# Patient Record
Sex: Female | Born: 1995 | Race: White | Hispanic: Yes | Marital: Single | Smoking: Never smoker
Health system: Southern US, Community
[De-identification: ages and names within clinical notes are randomized; demographics above are authoritative.]

## PROBLEM LIST (undated history)

## (undated) DIAGNOSIS — F32A Depression, unspecified: Secondary | ICD-10-CM

## (undated) DIAGNOSIS — F329 Major depressive disorder, single episode, unspecified: Secondary | ICD-10-CM

## (undated) DIAGNOSIS — F419 Anxiety disorder, unspecified: Secondary | ICD-10-CM

## (undated) HISTORY — PX: NO PAST SURGERIES: SHX2092

---

## 2016-03-25 ENCOUNTER — Encounter: Payer: Self-pay | Admitting: *Deleted

## 2016-03-25 ENCOUNTER — Encounter: Payer: Self-pay | Admitting: Certified Nurse Midwife

## 2016-03-25 ENCOUNTER — Ambulatory Visit (INDEPENDENT_AMBULATORY_CARE_PROVIDER_SITE_OTHER): Payer: Medicaid Other | Admitting: Certified Nurse Midwife

## 2016-03-25 VITALS — BP 109/73 | HR 70 | Ht 64.0 in

## 2016-03-25 DIAGNOSIS — Z30011 Encounter for initial prescription of contraceptive pills: Secondary | ICD-10-CM

## 2016-03-25 DIAGNOSIS — Z0000 Encounter for general adult medical examination without abnormal findings: Secondary | ICD-10-CM | POA: Diagnosis not present

## 2016-03-25 DIAGNOSIS — Z113 Encounter for screening for infections with a predominantly sexual mode of transmission: Secondary | ICD-10-CM

## 2016-03-25 DIAGNOSIS — Z8659 Personal history of other mental and behavioral disorders: Secondary | ICD-10-CM

## 2016-03-25 DIAGNOSIS — Z01419 Encounter for gynecological examination (general) (routine) without abnormal findings: Secondary | ICD-10-CM

## 2016-03-25 MED ORDER — NORETHIN-ETH ESTRAD-FE BIPHAS 1 MG-10 MCG / 10 MCG PO TABS: 1.0000 | ORAL_TABLET | Freq: Every day | ORAL | 4 refills | Status: DC

## 2016-03-25 NOTE — Patient Instructions (Addendum)
Contraception Choices  Contraception (birth control) is the use of any methods or devices to prevent pregnancy. Below are some methods to help avoid pregnancy.  HORMONAL METHODS   · Contraceptive implant. This is a thin, plastic tube containing progesterone hormone. It does not contain estrogen hormone. Your health care provider inserts the tube in the inner part of the upper arm. The tube can remain in place for up to 3 years. After 3 years, the implant must be removed. The implant prevents the ovaries from releasing an egg (ovulation), thickens the cervical mucus to prevent sperm from entering the uterus, and thins the lining of the inside of the uterus.  · Progesterone-only injections. These injections are given every 3 months by your health care provider to prevent pregnancy. This synthetic progesterone hormone stops the ovaries from releasing eggs. It also thickens cervical mucus and changes the uterine lining. This makes it harder for sperm to survive in the uterus.  · Birth control pills. These pills contain estrogen and progesterone hormone. They work by preventing the ovaries from releasing eggs (ovulation). They also cause the cervical mucus to thicken, preventing the sperm from entering the uterus. Birth control pills are prescribed by a health care provider. Birth control pills can also be used to treat heavy periods.  · Minipill. This type of birth control pill contains only the progesterone hormone. They are taken every day of each month and must be prescribed by your health care provider.  · Birth control patch. The patch contains hormones similar to those in birth control pills. It must be changed once a week and is prescribed by a health care provider.  · Vaginal ring. The ring contains hormones similar to those in birth control pills. It is left in the vagina for 3 weeks, removed for 1 week, and then a new one is put back in place. The patient must be comfortable inserting and removing the ring  from the vagina. A health care provider's prescription is necessary.  · Emergency contraception. Emergency contraceptives prevent pregnancy after unprotected sexual intercourse. This pill can be taken right after sex or up to 5 days after unprotected sex. It is most effective the sooner you take the pills after having sexual intercourse. Most emergency contraceptive pills are available without a prescription. Check with your pharmacist. Do not use emergency contraception as your only form of birth control.  BARRIER METHODS   · Female condom. This is a thin sheath (latex or rubber) that is worn over the penis during sexual intercourse. It can be used with spermicide to increase effectiveness.  · Female condom. This is a soft, loose-fitting sheath that is put into the vagina before sexual intercourse.  · Diaphragm. This is a soft, latex, dome-shaped barrier that must be fitted by a health care provider. It is inserted into the vagina, along with a spermicidal jelly. It is inserted before intercourse. The diaphragm should be left in the vagina for 6 to 8 hours after intercourse.  · Cervical cap. This is a round, soft, latex or plastic cup that fits over the cervix and must be fitted by a health care provider. The cap can be left in place for up to 48 hours after intercourse.  · Sponge. This is a soft, circular piece of polyurethane foam. The sponge has spermicide in it. It is inserted into the vagina after wetting it and before sexual intercourse.  · Spermicides. These are chemicals that kill or block sperm from entering the cervix and uterus. They   come in the form of creams, jellies, suppositories, foam, or tablets. They do not require a prescription. They are inserted into the vagina with an applicator before having sexual intercourse. The process must be repeated every time you have sexual intercourse.  INTRAUTERINE CONTRACEPTION  · Intrauterine device (IUD). This is a T-shaped device that is put in a woman's uterus  during a menstrual period to prevent pregnancy. There are 2 types:    Copper IUD. This type of IUD is wrapped in copper wire and is placed inside the uterus. Copper makes the uterus and fallopian tubes produce a fluid that kills sperm. It can stay in place for 10 years.    Hormone IUD. This type of IUD contains the hormone progestin (synthetic progesterone). The hormone thickens the cervical mucus and prevents sperm from entering the uterus, and it also thins the uterine lining to prevent implantation of a fertilized egg. The hormone can weaken or kill the sperm that get into the uterus. It can stay in place for 3-5 years, depending on which type of IUD is used.  PERMANENT METHODS OF CONTRACEPTION  · Female tubal ligation. This is when the woman's fallopian tubes are surgically sealed, tied, or blocked to prevent the egg from traveling to the uterus.  · Hysteroscopic sterilization. This involves placing a small coil or insert into each fallopian tube. Your doctor uses a technique called hysteroscopy to do the procedure. The device causes scar tissue to form. This results in permanent blockage of the fallopian tubes, so the sperm cannot fertilize the egg. It takes about 3 months after the procedure for the tubes to become blocked. You must use another form of birth control for these 3 months.  · Female sterilization. This is when the female has the tubes that carry sperm tied off (vasectomy). This blocks sperm from entering the vagina during sexual intercourse. After the procedure, the man can still ejaculate fluid (semen).  NATURAL PLANNING METHODS  · Natural family planning. This is not having sexual intercourse or using a barrier method (condom, diaphragm, cervical cap) on days the woman could become pregnant.  · Calendar method. This is keeping track of the length of each menstrual cycle and identifying when you are fertile.  · Ovulation method. This is avoiding sexual intercourse during ovulation.  · Symptothermal  method. This is avoiding sexual intercourse during ovulation, using a thermometer and ovulation symptoms.  · Post-ovulation method. This is timing sexual intercourse after you have ovulated.  Regardless of which type or method of contraception you choose, it is important that you use condoms to protect against the transmission of sexually transmitted infections (STIs). Talk with your health care provider about which form of contraception is most appropriate for you.     This information is not intended to replace advice given to you by your health care provider. Make sure you discuss any questions you have with your health care provider.     Document Released: 07/13/2005 Document Revised: 07/18/2013 Document Reviewed: 01/05/2013  Elsevier Interactive Patient Education ©2016 [employer]  Oral Contraception Information  Oral contraceptive pills (OCPs) are medicines taken to prevent pregnancy. OCPs work by preventing the ovaries from releasing eggs. The hormones in OCPs also cause the cervical mucus to thicken, preventing the sperm from entering the uterus. The hormones also cause the uterine lining to become thin, not allowing a fertilized egg to attach to the inside of the uterus. OCPs are highly effective when taken exactly as prescribed. However, OCPs do not   prevent sexually transmitted diseases (STDs). Safe sex practices, such as using condoms along with the pill, can help prevent STDs.   Before taking the pill, you may have a physical exam and Pap test. Your health care provider may order blood tests. The health care provider will make sure you are a good candidate for oral contraception. Discuss with your health care provider the possible side effects of the OCP you may be prescribed. When starting an OCP, it can take 2 to 3 months for the body to adjust to the changes in hormone levels in your body.   TYPES OF ORAL CONTRACEPTION  · The combination pill--This pill contains estrogen and progestin (synthetic  progesterone) hormones. The combination pill comes in 21-day, 28-day, or 91-day packs. Some types of combination pills are meant to be taken continuously (365-day pills). With 21-day packs, you do not take pills for 7 days after the last pill. With 28-day packs, the pill is taken every day. The last 7 pills are without hormones. Certain types of pills have more than 21 hormone-containing pills. With 91-day packs, the first 84 pills contain both hormones, and the last 7 pills contain no hormones or contain estrogen only.  · The minipill--This pill contains the progesterone hormone only. The pill is taken every day continuously. It is very important to take the pill at the same time each day. The minipill comes in packs of 28 pills. All 28 pills contain the hormone.    ADVANTAGES OF ORAL CONTRACEPTIVE PILLS  · Decreases premenstrual symptoms.    · Treats menstrual period cramps.    · Regulates the menstrual cycle.    · Decreases a heavy menstrual flow.    · May treat acne, depending on the type of pill.    · Treats abnormal uterine bleeding.    · Treats polycystic ovarian syndrome.    · Treats endometriosis.    · Can be used as emergency contraception.    THINGS THAT CAN MAKE ORAL CONTRACEPTIVE PILLS LESS EFFECTIVE  OCPs can be less effective if:   · You forget to take the pill at the same time every day.    · You have a stomach or intestinal disease that lessens the absorption of the pill.    · You take OCPs with other medicines that make OCPs less effective, such as antibiotics, certain HIV medicines, and some seizure medicines.    · You take expired OCPs.    · You forget to restart the pill on day 7, when using the packs of 21 pills.    RISKS ASSOCIATED WITH ORAL CONTRACEPTIVE PILLS   Oral contraceptive pills can sometimes cause side effects, such as:  · Headache.  · Nausea.  · Breast tenderness.  · Irregular bleeding or spotting.  Combination pills are also associated with a small increased risk of:  · Blood  clots.  · Heart attack.  · Stroke.     This information is not intended to replace advice given to you by your health care provider. Make sure you discuss any questions you have with your health care provider.     Document Released: 10/03/2002 Document Revised: 05/03/2013 Document Reviewed: 01/01/2013  Elsevier Interactive Patient Education ©2016 Else[employer] Inc.

## 2016-03-25 NOTE — Progress Notes (Signed)
Subjective:        Erica Mccarthy is a 20 y.o. female here for a routine exam.  Current complaints: desires birth control.  Desires full STD screening exam.  Desires birth control.  Is currently sexually active.  Periods are regular, normal, lasting about a week 5-7 days, has heavy periods and dysmenorrhea since she started.  20 years of age when she started having periods.  Was on OCPs in the past.       Irregular use of condoms.  LMP was the 15th of August.  Denies any chronic medical conditions.  [facility] [employer].    Personal health questionnaire:  Is patient Erica Mccarthy, have a family history of breast and/or ovarian cancer: yes, Erica Mccarthy Is there a family history of uterine cancer diagnosed at age < 6050, gastrointestinal cancer, urinary tract cancer, family member who is a Erica officerLynch syndrome-associated carrier: no Is the patient overweight and hypertensive, family history of diabetes, personal history of gestational diabetes, preeclampsia or PCOS: no Is patient over 4755, have PCOS,  family history of premature CHD under age 22, diabetes, smoke, have hypertension or peripheral artery disease:  yes At any time, has a partner hit, kicked or otherwise hurt or frightened you?: no; parents separated.   Over the past 2 weeks, have you felt down, depressed or hopeless?: yes, denies any homicidal/suicidal thoughts/plans.   Over the past 2 weeks, have you felt little interest or pleasure in doing things?:no   Gynecologic History Patient's last menstrual period was 03/08/2016. Contraception: condoms Last Pap: N/A.  Last mammogram: N/A.   Obstetric History OB History  No data available    No past medical history on file.  No past surgical history on file.   Current Outpatient Prescriptions:  .  Norethindrone-Ethinyl Estradiol-Fe Biphas (LO LOESTRIN FE) 1 MG-10 MCG / 10 MCG tablet, Take 1 tablet by mouth daily. Take 1 tablet by mouth daily., Disp: 3 Package, Rfl: 4 Not on File  Social  History  Substance Use Topics  . Smoking status: Never Smoker  . Smokeless tobacco: Never Used  . Alcohol use Yes     Comment: occ    Family History  Problem Relation Age of Onset  . Diabetes Paternal Grandfather   . Diabetes Father       Review of Systems  Constitutional: negative for fatigue and weight loss Respiratory: negative for cough and wheezing Cardiovascular: negative for chest pain, fatigue and palpitations Gastrointestinal: negative for abdominal pain and change in bowel habits Musculoskeletal:negative for myalgias Neurological: negative for gait problems and tremors Behavioral/Psych: negative for abusive relationship, depression Endocrine: negative for temperature intolerance   Genitourinary:negative for abnormal menstrual periods, genital lesions, hot flashes, sexual problems and vaginal discharge Integument/breast: negative for breast lump, breast tenderness, nipple discharge and skin lesion(s)    Objective:       BP 109/73   Pulse 70   Ht 5\' 4"  (1.626 m)   LMP 03/08/2016  General:   alert  Skin:   no rash or abnormalities  Lungs:   clear to auscultation bilaterally  Heart:   regular rate and rhythm, S1, S2 normal, no murmur, click, rub or gallop  Breasts:   normal without suspicious masses, skin or nipple changes or axillary nodes  Abdomen:  normal findings: no organomegaly, soft, non-tender and no hernia  Pelvis:  External genitalia: normal general appearance Urinary system: urethral meatus normal and bladder without fullness, nontender Vaginal: normal without tenderness, induration or masses Cervix: normal  appearance Adnexa: normal bimanual exam Uterus: anteverted and non-tender, normal size   Lab Review Urine pregnancy test Labs reviewed no Radiologic studies reviewed no  50% of 30 min visit spent on counseling and coordination of care.   Assessment:    Healthy female exam.   STD screening exam  Contraception management  H/O Depression   H/O AUB  Plan:    Education reviewed: calcium supplements, depression evaluation, low fat, low cholesterol diet, safe sex/STD prevention, self breast exams, skin cancer screening and weight bearing exercise. Contraception: OCP (estrogen/progesterone). Follow up in: 1 year.   Meds ordered this encounter  Medications  . Norethindrone-Ethinyl Estradiol-Fe Biphas (LO LOESTRIN FE) 1 MG-10 MCG / 10 MCG tablet    Sig: Take 1 tablet by mouth daily. Take 1 tablet by mouth daily.    Dispense:  3 Package    Refill:  4    BIN # [id number], PCN# [id number], ID#: [id number]   Orders Placed This Encounter  Procedures  . Hepatitis B surface antigen  . RPR  . Hepatitis C antibody  . HIV antibody  . CBC with Differential/Platelet  . Comprehensive metabolic panel  . TSH   Need to obtain previous records Possible management options include:LARK, nuva ring, patch Follow up as needed.

## 2016-03-26 LAB — COMPREHENSIVE METABOLIC PANEL
A/G RATIO: 1.4 (ref 1.2–2.2)
ALBUMIN: 4.4 g/dL (ref 3.5–5.5)
ALK PHOS: 49 IU/L (ref 39–117)
ALT: 9 IU/L (ref 0–32)
AST: 21 IU/L (ref 0–40)
BILIRUBIN TOTAL: 0.2 mg/dL (ref 0.0–1.2)
BUN / CREAT RATIO: 14 (ref 9–23)
BUN: 10 mg/dL (ref 6–20)
CHLORIDE: 99 mmol/L (ref 96–106)
CO2: 26 mmol/L (ref 18–29)
Calcium: 9.3 mg/dL (ref 8.7–10.2)
Creatinine, Ser: 0.72 mg/dL (ref 0.57–1.00)
GFR calc non Af Amer: 120 mL/min/{1.73_m2} (ref >59)
GFR, EST AFRICAN AMERICAN: 138 mL/min/{1.73_m2} (ref >59)
GLUCOSE: 57 mg/dL — AB (ref 65–99)
Globulin, Total: 3.2 g/dL (ref 1.5–4.5)
POTASSIUM: 3.9 mmol/L (ref 3.5–5.2)
Sodium: 140 mmol/L (ref 134–144)
TOTAL PROTEIN: 7.6 g/dL (ref 6.0–8.5)

## 2016-03-26 LAB — HIV ANTIBODY (ROUTINE TESTING W REFLEX): HIV SCREEN 4TH GENERATION: NONREACTIVE

## 2016-03-26 LAB — CBC WITH DIFFERENTIAL/PLATELET
BASOS ABS: 0 10*3/uL (ref 0.0–0.2)
BASOS: 1 %
EOS (ABSOLUTE): 0.1 10*3/uL (ref 0.0–0.4)
Eos: 2 %
HEMOGLOBIN: 11.7 g/dL (ref 11.1–15.9)
Hematocrit: 36 % (ref 34.0–46.6)
Immature Grans (Abs): 0 10*3/uL (ref 0.0–0.1)
Immature Granulocytes: 0 %
LYMPHS ABS: 1.4 10*3/uL (ref 0.7–3.1)
Lymphs: 39 %
MCH: 28.1 pg (ref 26.6–33.0)
MCHC: 32.5 g/dL (ref 31.5–35.7)
MCV: 87 fL (ref 79–97)
Monocytes Absolute: 0.4 10*3/uL (ref 0.1–0.9)
Monocytes: 12 %
NEUTROS ABS: 1.6 10*3/uL (ref 1.4–7.0)
Neutrophils: 46 %
PLATELETS: 247 10*3/uL (ref 150–379)
RBC: 4.16 x10E6/uL (ref 3.77–5.28)
RDW: 14.2 % (ref 12.3–15.4)
WBC: 3.5 10*3/uL (ref 3.4–10.8)

## 2016-03-26 LAB — RPR: RPR Ser Ql: NONREACTIVE

## 2016-03-26 LAB — HEPATITIS B SURFACE ANTIGEN: HEP B S AG: NEGATIVE

## 2016-03-26 LAB — HEPATITIS C ANTIBODY

## 2016-03-26 LAB — TSH: TSH: 2.19 u[IU]/mL (ref 0.450–4.500)

## 2016-03-28 LAB — PAP IG W/ RFLX HPV ASCU: PAP Smear Comment: 0

## 2016-03-28 LAB — NUSWAB VG+, CANDIDA 6SP
ATOPOBIUM VAGINAE: HIGH {score} — AB
BVAB 2: HIGH {score} — AB
CANDIDA KRUSEI, NAA: NEGATIVE
CANDIDA LUSITANIAE, NAA: NEGATIVE
CHLAMYDIA TRACHOMATIS, NAA: NEGATIVE
Candida albicans, NAA: NEGATIVE
Candida glabrata, NAA: NEGATIVE
Candida parapsilosis, NAA: NEGATIVE
Candida tropicalis, NAA: NEGATIVE
Megasphaera 1: HIGH Score — AB
NEISSERIA GONORRHOEAE, NAA: NEGATIVE
TRICH VAG BY NAA: NEGATIVE

## 2016-04-01 ENCOUNTER — Other Ambulatory Visit: Payer: Self-pay | Admitting: Certified Nurse Midwife

## 2016-04-01 DIAGNOSIS — N760 Acute vaginitis: Principal | ICD-10-CM

## 2016-04-01 DIAGNOSIS — B9689 Other specified bacterial agents as the cause of diseases classified elsewhere: Secondary | ICD-10-CM

## 2016-04-01 MED ORDER — METRONIDAZOLE 500 MG PO TABS: 500.0000 mg | ORAL_TABLET | Freq: Two times a day (BID) | ORAL | 0 refills | Status: DC

## 2016-04-02 ENCOUNTER — Telehealth: Payer: Self-pay | Admitting: *Deleted

## 2016-04-02 NOTE — Telephone Encounter (Signed)
Unable to leave voicemail mail box not set up

## 2016-04-02 NOTE — Telephone Encounter (Signed)
Patient returned call and made aware of lab results and medication called to pharmacy.Aware not to drink alcohol while taking this medication.Call for yeast like symptoms.

## 2016-04-12 ENCOUNTER — Emergency Department (HOSPITAL_COMMUNITY)
Admission: EM | Admit: 2016-04-12 | Discharge: 2016-04-12 | Disposition: A | Discharge status: Home / Self Care | Payer: Medicaid Other | Attending: Emergency Medicine | Admitting: Emergency Medicine

## 2016-04-12 ENCOUNTER — Encounter (HOSPITAL_COMMUNITY): Payer: Self-pay

## 2016-04-12 DIAGNOSIS — Z79899 Other long term (current) drug therapy: Secondary | ICD-10-CM | POA: Insufficient documentation

## 2016-04-12 DIAGNOSIS — R531 Weakness: Secondary | ICD-10-CM | POA: Insufficient documentation

## 2016-04-12 DIAGNOSIS — R200 Anesthesia of skin: Secondary | ICD-10-CM | POA: Diagnosis present

## 2016-04-12 DIAGNOSIS — Z91040 Latex allergy status: Secondary | ICD-10-CM | POA: Insufficient documentation

## 2016-04-12 NOTE — Discharge Instructions (Addendum)
Please read and follow all provided instructions.  Your diagnoses today include:  1. Weakness    Tests performed today include: Vital signs. See below for your results today.   Medications prescribed:  Take as prescribed   Home care instructions:  Follow any educational materials contained in this packet.  Follow-up instructions: Please follow-up with your primary care provider for further evaluation of symptoms and treatment   Return instructions:  Please return to the Emergency Department if you do not get better, if you get worse, or new symptoms OR  - Fever (temperature greater than 101.46F)  - Bleeding that does not stop with holding pressure to the area    -Severe pain (please note that you may be more sore the day after your accident)  - Chest Pain  - Difficulty breathing  - Severe nausea or vomiting  - Inability to tolerate food and liquids  - Passing out  - Skin becoming red around your wounds  - Change in mental status (confusion or lethargy)  - New numbness or weakness    Please return if you have any other emergent concerns.  Additional Information:  Your vital signs today were: BP 115/84 (BP Location: Left Arm)    Pulse 91    Temp 97.7 F (36.5 C) (Oral)    Resp 17    Ht 5\' 4"  (1.626 m)    Wt 63.5 kg    LMP 04/08/2016 (Exact Date)    SpO2 95%    BMI 24.03 kg/m  If your blood pressure (BP) was elevated above 135/85 this visit, please have this repeated by your doctor within one month. ---------------

## 2016-04-12 NOTE — ED Notes (Signed)
Pt was given ice water for po challenge. 

## 2016-04-12 NOTE — ED Provider Notes (Signed)
[facility]-EMERGENCY DEPT Provider Note   CSN: [id number] Arrival date & time: 04/12/16  0002  By signing my name below, I, Erica Mccarthy, attest that this documentation has been prepared under the direction and in the presence of non-physician practitioner, Audry Piliyler Damoni [name], PA-C. Electronically Signed: Modena JanskyAlbert Mccarthy, Scribe. 04/12/2016. 1:25 AM.  History   Chief Complaint Chief Complaint  Patient presents with  . Fatigue  . Numbness   The history is provided by the patient. No language interpreter was used.   HPI Comments: Erica Mccarthy is a 20 y.o. female who presents to the Emergency Department complaining of intermittent moderate left-sided numbness that started today. Started taking norethindrone BCP today and started having multiple symptoms, including numbness, confusion, headache, and fatigue. During the episode she ate a honeybun snack thinking her blood sugar was low without any relief. Her symptoms have gradually improved since initial onset. No symptoms currently. She started her menstrual period today. Denies any pain or other complaints.   History reviewed. No pertinent past medical history.  Patient Active Problem List   Diagnosis Date Noted  . History of depression 03/25/2016    History reviewed. No pertinent surgical history.  OB History    No data available       Home Medications    Prior to Admission medications   Medication Sig Start Date End Date Taking? Authorizing Provider  metroNIDAZOLE (FLAGYL) 500 MG tablet Take 1 tablet (500 mg total) by mouth 2 (two) times daily. 04/01/16   [name], CNM  Norethindrone-Ethinyl Estradiol-Fe Biphas (LO LOESTRIN FE) 1 MG-10 MCG / 10 MCG tablet Take 1 tablet by mouth daily. Take 1 tablet by mouth daily. 03/25/16   Roe Coombsachelle A Denney, CNM    Family History Family History  Problem Relation Age of Onset  . Diabetes Paternal Grandfather   . Diabetes Father     Social History Social History  Substance Use Topics  .  Smoking status: Never Smoker  . Smokeless tobacco: Never Used  . Alcohol use Yes     Comment: occasionally   Allergies   Latex  Review of Systems Review of Systems A complete 10 system review of systems was obtained and all systems are negative except as noted in the HPI and PMH.   Physical Exam Updated Vital Signs BP 115/84 (BP Location: Left Arm)   Pulse 91   Temp 97.7 F (36.5 C) (Oral)   Resp 17   Ht 5\' 4"  (1.626 m)   Wt 140 lb (63.5 kg)   LMP 04/08/2016 (Exact Date)   SpO2 95%   BMI 24.03 kg/m   Physical Exam  Constitutional: She is oriented to person, place, and time. She appears well-developed and well-nourished. No distress.  HENT:  Head: Normocephalic.  Eyes: Conjunctivae and EOM are normal. Pupils are equal, round, and reactive to light.  Neck: Normal range of motion. Neck supple.  Cardiovascular: Normal rate, regular rhythm, normal heart sounds and intact distal pulses.  Exam reveals no gallop and no friction rub.   No murmur heard. Pulmonary/Chest: Effort normal and breath sounds normal. No respiratory distress. She has no wheezes. She has no rales. She exhibits no tenderness.  Abdominal: Soft.  Musculoskeletal: Normal range of motion.  Pt able to ambulate with steady gait  Neurological: She is alert and oriented to person, place, and time. She has normal strength and normal reflexes. No cranial nerve deficit or sensory deficit. She displays a negative Romberg sign.  Cranial Nerves:  II: Pupils equal, round,  reactive to light III,IV, VI: ptosis not present, extra-ocular motions intact bilaterally  V,VII: smile symmetric, facial light touch sensation equal VIII: hearing grossly normal bilaterally  IX,X: midline uvula rise  XI: bilateral shoulder shrug equal and strong XII: midline tongue extension  Skin: Skin is warm and dry.  Psychiatric: She has a normal mood and affect.  Nursing note and vitals reviewed.  ED Treatments / Results  DIAGNOSTIC  STUDIES: Oxygen Saturation is 95% on RA, normal by my interpretation.    COORDINATION OF CARE: 1:29 AM- Pt advised of plan for treatment and pt agrees.  Labs (all labs ordered are listed, but only abnormal results are displayed) Labs Reviewed - No data to display  EKG  EKG Interpretation None      Radiology No results found.  Procedures Procedures (including critical care time)  Medications Ordered in ED Medications - No data to display   Initial Impression / Assessment and Plan / ED Course  I have reviewed the triage vital signs and the nursing notes.  Pertinent labs & imaging results that were available during my care of the patient were reviewed by me and considered in my medical decision making (see chart for details).  Clinical Course   Final Clinical Impressions(s) / ED Diagnoses  I have reviewed the relevant previous healthcare records. I obtained HPI from historian.  ED Course:  Assessment: Pt is a 19yF who presents with weakness and left sided numbness s/p PO intake of new OCP. Notes resolution of symptoms in ED. Was previously on OCP 4 years ago and just restarted today. No N/V. No CP/SOB/ABD pain. On exam, pt in NAD. Nontoxic/nonseptic appearing. VSS. Afebrile. Lungs CTA. Heart RRR. CN evaluated and unremarkable. Extremity x4 DTRs unremarkable. No obvious neuro deficits identified. Possible OCP side effect due to hormone imbalance vs. Onset of menses today. Pt without symptoms currently. Plan is to DC home with follow up to PCP. At time of discharge, Patient is in no acute distress. Vital Signs are stable. Patient is able to ambulate. Patient able to tolerate PO.    Disposition/Plan:  DC Home Additional Verbal discharge instructions given and discussed with patient.  Pt Instructed to f/u with PCP in the next week for evaluation and treatment of symptoms. Return precautions given Pt acknowledges and agrees with plan  Supervising Physician April Palumbo,  MD   Final diagnoses:  Weakness    New Prescriptions New Prescriptions   No medications on file   I personally performed the services described in this documentation, which was scribed in my presence. The recorded information has been reviewed and is accurate.     Audry Pili, PA-C 04/12/16 1610    April Palumbo, MD 04/12/16 [phone]

## 2016-04-12 NOTE — ED Triage Notes (Signed)
Patient c/o fatigue and numbness on the left side of body.  Patient also states that her left hand is "colder" then her right hand.  Patient states that feels that it is a reaction to the new birth control that she just took the first dose of yesterday at 1500.  Patient denies chest pain, denies SOB.  Roommate states that patient has had no other symptoms except for being tired.  Patient denies pain.

## 2016-04-12 NOTE — ED Notes (Signed)
Pt tolerated fluid challenge--- pt denies nausea.

## 2016-04-22 ENCOUNTER — Encounter (HOSPITAL_COMMUNITY): Payer: Self-pay

## 2016-04-22 ENCOUNTER — Emergency Department (HOSPITAL_COMMUNITY)
Admission: EM | Admit: 2016-04-22 | Discharge: 2016-04-22 | Disposition: A | Discharge status: Home / Self Care | Payer: Medicaid Other | Attending: Emergency Medicine | Admitting: Emergency Medicine

## 2016-04-22 DIAGNOSIS — Z207 Contact with and (suspected) exposure to pediculosis, acariasis and other infestations: Secondary | ICD-10-CM

## 2016-04-22 DIAGNOSIS — Z2089 Contact with and (suspected) exposure to other communicable diseases: Secondary | ICD-10-CM

## 2016-04-22 DIAGNOSIS — R21 Rash and other nonspecific skin eruption: Secondary | ICD-10-CM | POA: Diagnosis present

## 2016-04-22 DIAGNOSIS — Z91040 Latex allergy status: Secondary | ICD-10-CM | POA: Insufficient documentation

## 2016-04-22 MED ORDER — PERMETHRIN 5 % EX CREA
TOPICAL_CREAM | CUTANEOUS | 0 refills | Status: DC
Start: 2016-04-22 — End: 2017-03-24

## 2016-04-22 NOTE — ED Provider Notes (Signed)
[facility]-EMERGENCY DEPT Provider Note   CSN: [id number] Arrival date & time: 04/22/16  1953     History   Chief Complaint Chief Complaint  Patient presents with  . Rash    HPI Erica Mccarthy is a 20 y.o. female presents with exposure to scabies. No significant PMH. She states her roommate who she lives in close quarters with (shares bathroom, common living spaces, etc) was diagnosed with scabies on Monday. She is unsure if she is treating herself or not. Denies rash on hands or feet but has several areas on thigh which is itchy and she is concerned about. Denies fever, chills, diffuse rash. She has been washing all of her clothes and linen and has a mattress protector. She would like a medication to treat herself in case she develops a worsening rash.  HPI  History reviewed. No pertinent past medical history.  Patient Active Problem List   Diagnosis Date Noted  . History of depression 03/25/2016    History reviewed. No pertinent surgical history.  OB History    No data available       Home Medications    Prior to Admission medications   Medication Sig Start Date End Date Taking? Authorizing Provider  metroNIDAZOLE (FLAGYL) 500 MG tablet Take 1 tablet (500 mg total) by mouth 2 (two) times daily. 04/01/16   [name], CNM  Norethindrone-Ethinyl Estradiol-Fe Biphas (LO LOESTRIN FE) 1 MG-10 MCG / 10 MCG tablet Take 1 tablet by mouth daily. Take 1 tablet by mouth daily. 03/25/16   [name], CNM  permethrin (ELIMITE) 5 % cream Apply from the neck down at bedtime. In the morning wash off. Repeat in 1 week if needed 04/22/16   Bethel BornKelly Marie Maycel Riffe, PA-C    Family History Family History  Problem Relation Age of Onset  . Diabetes Paternal Grandfather   . Diabetes Father     Social History Social History  Substance Use Topics  . Smoking status: Never Smoker  . Smokeless tobacco: Never Used  . Alcohol use Yes     Comment: occasionally     Allergies    Latex   Review of Systems Review of Systems  Constitutional: Negative for fever.  Skin:       Possible bites on right upper thigh     Physical Exam Updated Vital Signs BP 119/73 (BP Location: Left Arm)   Pulse 63   Temp 98.1 F (36.7 C) (Oral)   Resp 15   LMP 04/08/2016 (Exact Date)   SpO2 100%   Physical Exam  Constitutional: She is oriented to person, place, and time. She appears well-developed and well-nourished. No distress.  HENT:  Head: Normocephalic and atraumatic.  Eyes: Conjunctivae are normal. Pupils are equal, round, and reactive to light. Right eye exhibits no discharge. Left eye exhibits no discharge. No scleral icterus.  Neck: Normal range of motion. Neck supple.  Cardiovascular: Normal rate and regular rhythm.   No murmur heard. Pulmonary/Chest: Effort normal and breath sounds normal. No respiratory distress.  Abdominal: Soft. She exhibits no distension. There is no tenderness.  Musculoskeletal: She exhibits no edema.  Neurological: She is alert and oriented to person, place, and time.  Skin: Skin is warm and dry.  Small red bumps on right upper thigh. No rash on hands or feet.  Psychiatric: She has a normal mood and affect. Her behavior is normal.  Nursing note and vitals reviewed.    ED Treatments / Results  Labs (all labs ordered are  listed, but only abnormal results are displayed) Labs Reviewed - No data to display  EKG  EKG Interpretation None       Radiology No results found.  Procedures Procedures (including critical care time)  Medications Ordered in ED Medications - No data to display   Initial Impression / Assessment and Plan / ED Course  I have reviewed the triage vital signs and the nursing notes.  Pertinent labs & imaging results that were available during my care of the patient were reviewed by me and considered in my medical decision making (see chart for details).  Clinical Course   20 year old with scabies  exposure. Will rx prophylactic Permethrin. Patient is NAD, non-toxic, with stable VS. Patient is informed of clinical course, understands medical decision making process, and agrees with plan. Opportunity for questions provided and all questions answered. Return precautions given.   Final Clinical Impressions(s) / ED Diagnoses   Final diagnoses:  Exposure to scabies    New Prescriptions Discharge Medication List as of 04/22/2016  9:38 PM    START taking these medications   Details  permethrin (ELIMITE) 5 % cream Apply from the neck down at bedtime. In the morning wash off. Repeat in 1 week if needed, Print         Bethel Born, PA-C 04/22/16 2224    Pricilla Loveless, MD 04/27/16 [id number]

## 2016-04-22 NOTE — ED Notes (Signed)
Patient was alert, oriented and stable upon discharge. RN went over AVS and patient had no further questions.  

## 2016-04-22 NOTE — ED Triage Notes (Signed)
Pts roommate was dx with scabies on Monday and now she is itching all over and wants to be checked.

## 2017-03-16 ENCOUNTER — Encounter: Payer: Self-pay | Admitting: Family Medicine

## 2017-03-16 ENCOUNTER — Ambulatory Visit (INDEPENDENT_AMBULATORY_CARE_PROVIDER_SITE_OTHER): Payer: Medicaid Other

## 2017-03-16 DIAGNOSIS — Z3201 Encounter for pregnancy test, result positive: Secondary | ICD-10-CM | POA: Diagnosis present

## 2017-03-16 LAB — POCT PREGNANCY, URINE: Preg Test, Ur: POSITIVE — AB

## 2017-03-16 NOTE — Progress Notes (Signed)
Patient presented to the office for a UPT. UPT positive. Patient was unsure of LMP. Scheduled patient for an ultrasound.

## 2017-03-23 ENCOUNTER — Encounter (HOSPITAL_COMMUNITY): Payer: Self-pay | Admitting: *Deleted

## 2017-03-23 ENCOUNTER — Ambulatory Visit (HOSPITAL_COMMUNITY)
Admission: RE | Admit: 2017-03-23 | Discharge: 2017-03-23 | Disposition: A | Discharge status: Home / Self Care | Payer: Medicaid Other | Source: Ambulatory Visit | Attending: Family Medicine | Admitting: Family Medicine

## 2017-03-23 ENCOUNTER — Inpatient Hospital Stay (HOSPITAL_COMMUNITY): Payer: Medicaid Other | Admitting: Anesthesiology

## 2017-03-23 ENCOUNTER — Ambulatory Visit (HOSPITAL_COMMUNITY)
Admission: AD | Admit: 2017-03-23 | Discharge: 2017-03-24 | Disposition: A | Discharge status: Home / Self Care | Payer: Medicaid Other | Source: Ambulatory Visit | Attending: Family Medicine | Admitting: Family Medicine

## 2017-03-23 ENCOUNTER — Ambulatory Visit: Payer: Medicaid Other | Admitting: Certified Nurse Midwife

## 2017-03-23 ENCOUNTER — Encounter (HOSPITAL_COMMUNITY)
Admission: AD | Disposition: A | Discharge status: Home / Self Care | Payer: Self-pay | Source: Ambulatory Visit | Attending: Family Medicine

## 2017-03-23 DIAGNOSIS — Z3201 Encounter for pregnancy test, result positive: Secondary | ICD-10-CM

## 2017-03-23 DIAGNOSIS — O00101 Right tubal pregnancy without intrauterine pregnancy: Secondary | ICD-10-CM | POA: Diagnosis not present

## 2017-03-23 DIAGNOSIS — K661 Hemoperitoneum: Secondary | ICD-10-CM

## 2017-03-23 DIAGNOSIS — Z91040 Latex allergy status: Secondary | ICD-10-CM | POA: Insufficient documentation

## 2017-03-23 HISTORY — PX: DIAGNOSTIC LAPAROSCOPY WITH REMOVAL OF ECTOPIC PREGNANCY: SHX6449

## 2017-03-23 HISTORY — DX: Anxiety disorder, unspecified: F41.9

## 2017-03-23 HISTORY — DX: Depression, unspecified: F32.A

## 2017-03-23 HISTORY — DX: Major depressive disorder, single episode, unspecified: F32.9

## 2017-03-23 LAB — CBC
HCT: 28.9 % — ABNORMAL LOW (ref 36.0–46.0)
Hemoglobin: 10 g/dL — ABNORMAL LOW (ref 12.0–15.0)
MCH: 29.8 pg (ref 26.0–34.0)
MCHC: 34.6 g/dL (ref 30.0–36.0)
MCV: 86 fL (ref 78.0–100.0)
PLATELETS: 252 10*3/uL (ref 150–400)
RBC: 3.36 MIL/uL — AB (ref 3.87–5.11)
RDW: 13 % (ref 11.5–15.5)
WBC: 5.2 10*3/uL (ref 4.0–10.5)

## 2017-03-23 LAB — TYPE AND SCREEN
ABO/RH(D): O POS
ANTIBODY SCREEN: NEGATIVE

## 2017-03-23 LAB — HCG, QUANTITATIVE, PREGNANCY: HCG, BETA CHAIN, QUANT, S: 2819 m[IU]/mL — AB (ref <5)

## 2017-03-23 LAB — HEPATITIS B SURFACE ANTIGEN: Hepatitis B Surface Ag: NEGATIVE

## 2017-03-23 SURGERY — LAPAROSCOPY, WITH ECTOPIC PREGNANCY SURGICAL TREATMENT
Anesthesia: General | Site: Abdomen

## 2017-03-23 MED ORDER — SUCCINYLCHOLINE CHLORIDE 200 MG/10ML IV SOSY
PREFILLED_SYRINGE | INTRAVENOUS | Status: AC
  Filled 2017-03-23: qty 10

## 2017-03-23 MED ORDER — FAMOTIDINE IN NACL 20-0.9 MG/50ML-% IV SOLN
INTRAVENOUS | Status: AC
  Filled 2017-03-23: qty 50

## 2017-03-23 MED ORDER — SUGAMMADEX SODIUM 200 MG/2ML IV SOLN
INTRAVENOUS | Status: DC | PRN
  Administered 2017-03-23: 130 mg via INTRAVENOUS

## 2017-03-23 MED ORDER — MEPERIDINE HCL 25 MG/ML IJ SOLN: 6.2500 mg | INTRAMUSCULAR | Status: DC | PRN

## 2017-03-23 MED ORDER — FENTANYL CITRATE (PF) 250 MCG/5ML IJ SOLN
INTRAMUSCULAR | Status: AC
  Filled 2017-03-23: qty 5

## 2017-03-23 MED ORDER — MIDAZOLAM HCL 2 MG/2ML IJ SOLN
INTRAMUSCULAR | Status: AC
  Filled 2017-03-23: qty 2

## 2017-03-23 MED ORDER — SODIUM CHLORIDE 0.9 % IR SOLN
Status: DC | PRN
  Administered 2017-03-23: 3000 mL

## 2017-03-23 MED ORDER — ROCURONIUM BROMIDE 100 MG/10ML IV SOLN
INTRAVENOUS | Status: DC | PRN
  Administered 2017-03-23: 25 mg via INTRAVENOUS

## 2017-03-23 MED ORDER — HYDROMORPHONE HCL 1 MG/ML IJ SOLN
INTRAMUSCULAR | Status: AC
  Filled 2017-03-23: qty 1

## 2017-03-23 MED ORDER — SOD CITRATE-CITRIC ACID 500-334 MG/5ML PO SOLN
30.0000 mL | Freq: Once | ORAL | Status: AC
  Administered 2017-03-23: 30 mL via ORAL

## 2017-03-23 MED ORDER — PROPOFOL 10 MG/ML IV BOLUS
INTRAVENOUS | Status: DC | PRN
  Administered 2017-03-23: 190 mg via INTRAVENOUS

## 2017-03-23 MED ORDER — PROPOFOL 10 MG/ML IV BOLUS
INTRAVENOUS | Status: AC
  Filled 2017-03-23: qty 20

## 2017-03-23 MED ORDER — GLYCOPYRROLATE 0.2 MG/ML IJ SOLN
INTRAMUSCULAR | Status: AC
  Filled 2017-03-23: qty 1

## 2017-03-23 MED ORDER — METOCLOPRAMIDE HCL 5 MG/ML IJ SOLN: 10.0000 mg | Freq: Once | INTRAMUSCULAR | Status: DC | PRN

## 2017-03-23 MED ORDER — IBUPROFEN 600 MG PO TABS: 600.0000 mg | ORAL_TABLET | Freq: Four times a day (QID) | ORAL | 0 refills | Status: DC | PRN

## 2017-03-23 MED ORDER — LIDOCAINE HCL (CARDIAC) 20 MG/ML IV SOLN
INTRAVENOUS | Status: AC
  Filled 2017-03-23: qty 5

## 2017-03-23 MED ORDER — ONDANSETRON HCL 4 MG/2ML IJ SOLN
INTRAMUSCULAR | Status: DC | PRN
  Administered 2017-03-23: 4 mg via INTRAVENOUS

## 2017-03-23 MED ORDER — OXYCODONE-ACETAMINOPHEN 5-325 MG PO TABS: 1.0000 | ORAL_TABLET | Freq: Four times a day (QID) | ORAL | 0 refills | Status: DC | PRN

## 2017-03-23 MED ORDER — PHENYLEPHRINE HCL 10 MG/ML IJ SOLN
INTRAMUSCULAR | Status: DC | PRN
  Administered 2017-03-23 (×2): 80 ug via INTRAVENOUS

## 2017-03-23 MED ORDER — DEXAMETHASONE SODIUM PHOSPHATE 10 MG/ML IJ SOLN
INTRAMUSCULAR | Status: AC
  Filled 2017-03-23: qty 1

## 2017-03-23 MED ORDER — DEXAMETHASONE SODIUM PHOSPHATE 4 MG/ML IJ SOLN
INTRAMUSCULAR | Status: DC | PRN
  Administered 2017-03-23: 10 mg via INTRAVENOUS

## 2017-03-23 MED ORDER — KETOROLAC TROMETHAMINE 30 MG/ML IJ SOLN
30.0000 mg | Freq: Once | INTRAMUSCULAR | Status: AC
  Administered 2017-03-23: 30 mg via INTRAVENOUS

## 2017-03-23 MED ORDER — SUGAMMADEX SODIUM 200 MG/2ML IV SOLN
INTRAVENOUS | Status: AC
  Filled 2017-03-23: qty 2

## 2017-03-23 MED ORDER — SUCCINYLCHOLINE CHLORIDE 20 MG/ML IJ SOLN
INTRAMUSCULAR | Status: DC | PRN
  Administered 2017-03-23: 100 mg via INTRAVENOUS

## 2017-03-23 MED ORDER — FENTANYL CITRATE (PF) 100 MCG/2ML IJ SOLN: 25.0000 ug | INTRAMUSCULAR | Status: DC | PRN

## 2017-03-23 MED ORDER — BUPIVACAINE HCL (PF) 0.5 % IJ SOLN
INTRAMUSCULAR | Status: DC | PRN
  Administered 2017-03-23: 10 mL

## 2017-03-23 MED ORDER — KETOROLAC TROMETHAMINE 30 MG/ML IJ SOLN
INTRAMUSCULAR | Status: AC
  Administered 2017-03-23: 30 mg via INTRAVENOUS
  Filled 2017-03-23: qty 1

## 2017-03-23 MED ORDER — SOD CITRATE-CITRIC ACID 500-334 MG/5ML PO SOLN
ORAL | Status: AC
  Filled 2017-03-23: qty 15

## 2017-03-23 MED ORDER — LIDOCAINE HCL (CARDIAC) 20 MG/ML IV SOLN
INTRAVENOUS | Status: DC | PRN
  Administered 2017-03-23: 90 mg via INTRAVENOUS

## 2017-03-23 MED ORDER — FAMOTIDINE IN NACL 20-0.9 MG/50ML-% IV SOLN
20.0000 mg | Freq: Once | INTRAVENOUS | Status: AC
  Administered 2017-03-23: 20 mg via INTRAVENOUS

## 2017-03-23 MED ORDER — ONDANSETRON HCL 4 MG/2ML IJ SOLN
INTRAMUSCULAR | Status: AC
  Filled 2017-03-23: qty 2

## 2017-03-23 MED ORDER — GLYCOPYRROLATE 0.2 MG/ML IJ SOLN
INTRAMUSCULAR | Status: DC | PRN
  Administered 2017-03-23: 0.1 mg via INTRAVENOUS

## 2017-03-23 MED ORDER — ROCURONIUM BROMIDE 100 MG/10ML IV SOLN
INTRAVENOUS | Status: AC
  Filled 2017-03-23: qty 1

## 2017-03-23 MED ORDER — LACTATED RINGERS IV SOLN: INTRAVENOUS | Status: DC

## 2017-03-23 MED ORDER — MIDAZOLAM HCL 2 MG/2ML IJ SOLN
INTRAMUSCULAR | Status: DC | PRN
  Administered 2017-03-23: 2 mg via INTRAVENOUS

## 2017-03-23 MED ORDER — LACTATED RINGERS IV SOLN
INTRAVENOUS | Status: DC | PRN
  Administered 2017-03-23 (×3): via INTRAVENOUS

## 2017-03-23 MED ORDER — FENTANYL CITRATE (PF) 100 MCG/2ML IJ SOLN
INTRAMUSCULAR | Status: DC | PRN
  Administered 2017-03-23: 100 ug via INTRAVENOUS
  Administered 2017-03-23 (×3): 50 ug via INTRAVENOUS

## 2017-03-23 SURGICAL SUPPLY — 29 items
CABLE HIGH FREQUENCY MONO STRZ (ELECTRODE) IMPLANT
CATH ROBINSON RED A/P 16FR (CATHETERS) IMPLANT
CLOTH BEACON ORANGE TIMEOUT ST (SAFETY) ×3 IMPLANT
DERMABOND ADVANCED (GAUZE/BANDAGES/DRESSINGS) ×2
DERMABOND ADVANCED .7 DNX12 (GAUZE/BANDAGES/DRESSINGS) ×1 IMPLANT
DURAPREP 26ML APPLICATOR (WOUND CARE) ×3 IMPLANT
GLOVE BIO SURGEON STRL SZ 6.5 (GLOVE) ×2 IMPLANT
GLOVE BIO SURGEONS STRL SZ 6.5 (GLOVE) ×1
GLOVE BIOGEL PI IND STRL 7.0 (GLOVE) ×3 IMPLANT
GLOVE BIOGEL PI INDICATOR 7.0 (GLOVE) ×6
GLOVE ECLIPSE 7.0 STRL STRAW (GLOVE) ×3 IMPLANT
GOWN STRL REUS W/TWL LRG LVL3 (GOWN DISPOSABLE) ×9 IMPLANT
NS IRRIG 1000ML POUR BTL (IV SOLUTION) ×3 IMPLANT
PACK LAPAROSCOPY BASIN (CUSTOM PROCEDURE TRAY) ×3 IMPLANT
PACK TRENDGUARD 450 HYBRID PRO (MISCELLANEOUS) ×1 IMPLANT
PACK TRENDGUARD 600 HYBRD PROC (MISCELLANEOUS) IMPLANT
POUCH SPECIMEN RETRIEVAL 10MM (ENDOMECHANICALS) ×3 IMPLANT
PROTECTOR NERVE ULNAR (MISCELLANEOUS) ×6 IMPLANT
SET IRRIG TUBING LAPAROSCOPIC (IRRIGATION / IRRIGATOR) ×3 IMPLANT
SHEARS HARMONIC ACE PLUS 36CM (ENDOMECHANICALS) IMPLANT
SLEEVE XCEL OPT CAN 5 100 (ENDOMECHANICALS) ×3 IMPLANT
SUT MNCRL AB 4-0 PS2 18 (SUTURE) ×6 IMPLANT
SUT VICRYL 0 UR6 27IN ABS (SUTURE) ×3 IMPLANT
TOWEL OR 17X24 6PK STRL BLUE (TOWEL DISPOSABLE) ×6 IMPLANT
TRAY FOLEY CATH SILVER 14FR (SET/KITS/TRAYS/PACK) ×3 IMPLANT
TRENDGUARD 450 HYBRID PRO PACK (MISCELLANEOUS) ×3
TRENDGUARD 600 HYBRID PROC PK (MISCELLANEOUS)
TROCAR XCEL NON-BLD 11X100MML (ENDOMECHANICALS) ×3 IMPLANT
TROCAR XCEL NON-BLD 5MMX100MML (ENDOMECHANICALS) ×3 IMPLANT

## 2017-03-23 NOTE — Progress Notes (Signed)
Subjective:     Erica Mccarthy is a 21 y.o. female G1P0 here for Ob US follow up. [place] was performed today for unsure LMP.  Current complaints: none. Denies VB or abdominal pain. Hx of Chlamydia once, treated. Nonsmoker.   Gynecologic History No LMP recorded. Contraception: none  Obstetric History G1  The following portions of the patient's history were reviewed and updated as appropriate: allergies, current medications, past family history, past medical history, past social history, past surgical history and problem list.  Review of Systems Pertinent items are noted in HPI.    Objective:    There were no vitals taken for this visit. General appearance: alert, cooperative and no distress Head: Normocephalic, without obvious abnormality, atraumatic Lungs: nml rate and effort Neurologic: Grossly normal    US Ob Comp Less 14 Wks  Result Date: 03/23/2017 CLINICAL DATA:  Establish gestational age EXAM: OBSTETRIC <14 WK [place] AND TRANSVAGINAL OB US TECHNIQUE: Both transabdominal and transvaginal ultrasound examinations were performed for complete evaluation of the gestation as well as the maternal uterus, adnexal regions, and pelvic cul-de-sac. Transvaginal technique was performed to assess early pregnancy. COMPARISON:  None. FINDINGS: Intrauterine gestational sac: None visualized Yolk sac:  Not visualized Embryo:  Not visualized Cardiac Activity: Heart Rate:   bpm MSD:   mm    w     d CRL:    mm    w    d                  [place] EDC: Subchorionic hemorrhage:  N/A Maternal uterus/adnexae: Corpus luteal cyst in the right ovary. Complex right adnexal mass superior to the right ovary measures 2.3 x 2.5 x 2.0 cm. Cannot exclude ectopic pregnancy. Moderate free fluid in the pelvis. IMPRESSION: Findings concerning for ectopic pregnancy in the right adnexa. Moderate free fluid. Critical Value/emergent results were called by telephone at the time of interpretation on 03/23/2017 at 3:56 pm to Dr. Candelaria Celeste ,  who verbally acknowledged these results. Electronically Signed   By: Charlett Nose M.D.   On: 03/23/2017 16:01   US Ob Transvaginal  Result Date: 03/23/2017 CLINICAL DATA:  Establish gestational age EXAM: OBSTETRIC <14 WK [place] AND TRANSVAGINAL OB US TECHNIQUE: Both transabdominal and transvaginal ultrasound examinations were performed for complete evaluation of the gestation as well as the maternal uterus, adnexal regions, and pelvic cul-de-sac. Transvaginal technique was performed to assess early pregnancy. COMPARISON:  None. FINDINGS: Intrauterine gestational sac: None visualized Yolk sac:  Not visualized Embryo:  Not visualized Cardiac Activity: Heart Rate:   bpm MSD:   mm    w     d CRL:    mm    w    d                  [place] EDC: Subchorionic hemorrhage:  N/A Maternal uterus/adnexae: Corpus luteal cyst in the right ovary. Complex right adnexal mass superior to the right ovary measures 2.3 x 2.5 x 2.0 cm. Cannot exclude ectopic pregnancy. Moderate free fluid in the pelvis. IMPRESSION: Findings concerning for ectopic pregnancy in the right adnexa. Moderate free fluid. Critical Value/emergent results were called by telephone at the time of interpretation on 03/23/2017 at 3:56 pm to Dr. Candelaria Celeste , who verbally acknowledged these results. Electronically Signed   By: Charlett Nose M.D.   On: 03/23/2017 16:01   Assessment:  Ectopic pregnancy   Plan:  Consult with Dr. Vergie Living  To MAU for further workup and  management    NPO  Total encounter 10 min, >50% spent counseling

## 2017-03-23 NOTE — H&P (Signed)
History     CSN: [id number]  Arrival date and time: 03/23/17 1641   None     Chief Complaint  Patient presents with  . Ectopic Pregnancy   HPI Patient is a G1P0 who had an viability scan earlier today that shows a right sided ectopic pregnancy measuring 2.3 x 2.5 x 2.0 cm. Moderate free fluid in pelvis. She has no other complaints. This is not a wanted pregnancy.    OB History    Gravida Para Term Preterm AB Living   1             SAB TAB Ectopic Multiple Live Births                  Past Medical History:  Diagnosis Date  . Anxiety   . Depression    not on meds, doing ok, has good support    Past Surgical History:  Procedure Laterality Date  . NO PAST SURGERIES      Family History  Problem Relation Age of Onset  . Diabetes Paternal Grandfather   . Hypertension Paternal Grandfather   . Diabetes Father   . Aneurysm Paternal Grandmother     Social History  Substance Use Topics  . Smoking status: Never Smoker  . Smokeless tobacco: Never Used  . Alcohol use Yes     Comment: occasionally, none since +PREG    Allergies:  Allergies  Allergen Reactions  . Latex Rash    Long term exposure.    Prescriptions Prior to Admission  Medication Sig Dispense Refill Last Dose  . metroNIDAZOLE (FLAGYL) 500 MG tablet Take 1 tablet (500 mg total) by mouth 2 (two) times daily. (Patient not taking: Reported on 03/16/2017) 14 tablet 0 Not Taking  . Norethindrone-Ethinyl Estradiol-Fe Biphas (LO LOESTRIN FE) 1 MG-10 MCG / 10 MCG tablet Take 1 tablet by mouth daily. Take 1 tablet by mouth daily. (Patient not taking: Reported on 03/16/2017) 3 Package 4 Not Taking  . permethrin (ELIMITE) 5 % cream Apply from the neck down at bedtime. In the morning wash off. Repeat in 1 week if needed (Patient not taking: Reported on 03/16/2017) 60 g 0 Not Taking    Review of Systems Physical Exam   Blood pressure 106/67, pulse 74, temperature 98.4 F (36.9 C), temperature source Oral, resp. rate  16, weight 141 lb 4 oz (64.1 kg), last menstrual period 02/28/2017, SpO2 100 %.  Physical Exam  Constitutional: She is oriented to person, place, and time. She appears well-developed and well-nourished.  HENT:  Head: Normocephalic and atraumatic.  Right Ear: External ear normal.  Left Ear: External ear normal.  Eyes: Pupils are equal, round, and reactive to light. Conjunctivae are normal.  Neck: Normal range of motion.  Cardiovascular: Normal rate and regular rhythm.   Respiratory: Effort normal.  GI: Soft. She exhibits no distension. There is tenderness (RLQ TTP). There is no rebound and no guarding.  Neurological: She is alert and oriented to person, place, and time.  Skin: Skin is warm and dry.  Psychiatric: She has a normal mood and affect. Her behavior is normal. Judgment and thought content normal.   CBC    Component Value Date/Time   WBC 5.2 03/23/2017 1729   RBC 3.36 (L) 03/23/2017 1729   HGB 10.0 (L) 03/23/2017 1729   HGB 11.7 03/25/2016 1535   HCT 28.9 (L) 03/23/2017 1729   HCT 36.0 03/25/2016 1535   PLT 252 03/23/2017 1729   PLT 247 03/25/2016 1535  MCV 86.0 03/23/2017 1729   MCV 87 03/25/2016 1535   MCH 29.8 03/23/2017 1729   MCHC 34.6 03/23/2017 1729   RDW 13.0 03/23/2017 1729   RDW 14.2 03/25/2016 1535   LYMPHSABS 1.4 03/25/2016 1535   EOSABS 0.1 03/25/2016 1535   BASOSABS 0.0 03/25/2016 1535    US Ob Comp Less 14 Wks  Result Date: 03/23/2017 CLINICAL DATA:  Establish gestational age EXAM: OBSTETRIC <14 WK [place] AND TRANSVAGINAL OB US TECHNIQUE: Both transabdominal and transvaginal ultrasound examinations were performed for complete evaluation of the gestation as well as the maternal uterus, adnexal regions, and pelvic cul-de-sac. Transvaginal technique was performed to assess early pregnancy. COMPARISON:  None. FINDINGS: Intrauterine gestational sac: None visualized Yolk sac:  Not visualized Embryo:  Not visualized Cardiac Activity: Heart Rate:   bpm MSD:   mm     w     d CRL:    mm    w    d                  [place] EDC: Subchorionic hemorrhage:  N/A Maternal uterus/adnexae: Corpus luteal cyst in the right ovary. Complex right adnexal mass superior to the right ovary measures 2.3 x 2.5 x 2.0 cm. Cannot exclude ectopic pregnancy. Moderate free fluid in the pelvis. IMPRESSION: Findings concerning for ectopic pregnancy in the right adnexa. Moderate free fluid. Critical Value/emergent results were called by telephone at the time of interpretation on 03/23/2017 at 3:56 pm to Dr. Candelaria Celeste , who verbally acknowledged these results. Electronically Signed   By: Charlett Nose M.D.   On: 03/23/2017 16:01   US Ob Transvaginal  Result Date: 03/23/2017 CLINICAL DATA:  Establish gestational age EXAM: OBSTETRIC <14 WK [place] AND TRANSVAGINAL OB US TECHNIQUE: Both transabdominal and transvaginal ultrasound examinations were performed for complete evaluation of the gestation as well as the maternal uterus, adnexal regions, and pelvic cul-de-sac. Transvaginal technique was performed to assess early pregnancy. COMPARISON:  None. FINDINGS: Intrauterine gestational sac: None visualized Yolk sac:  Not visualized Embryo:  Not visualized Cardiac Activity: Heart Rate:   bpm MSD:   mm    w     d CRL:    mm    w    d                  [place] EDC: Subchorionic hemorrhage:  N/A Maternal uterus/adnexae: Corpus luteal cyst in the right ovary. Complex right adnexal mass superior to the right ovary measures 2.3 x 2.5 x 2.0 cm. Cannot exclude ectopic pregnancy. Moderate free fluid in the pelvis. IMPRESSION: Findings concerning for ectopic pregnancy in the right adnexa. Moderate free fluid. Critical Value/emergent results were called by telephone at the time of interpretation on 03/23/2017 at 3:56 pm to Dr. Candelaria Celeste , who verbally acknowledged these results. Electronically Signed   By: Charlett Nose M.D.   On: 03/23/2017 16:01   [facility] Course  Procedures  MDM Hg down 1.7 from last year, I am concerned about  ruptured ectopic. I discussed this with patient, including surgical option. Risks discussed with patient including: infection, bleeding, injury to intestines and bladder, blood clots, pain.  Pt NPO since 10am. Continue NPO status.  Assessment and Plan  1. Ectopic pregnancy To OR when ready.  [name] 03/23/2017, 5:39 PM

## 2017-03-23 NOTE — Op Note (Signed)
Erica Mccarthy PROCEDURE DATE: 03/23/2017  PREOPERATIVE DIAGNOSIS: Ruptured ectopic pregnancy POSTOPERATIVE DIAGNOSIS: Ruptured right fallopian tube ectopic pregnancy PROCEDURE: Laparoscopic right salpingectomy, partial omentectomy and removal of ectopic pregnancy SURGEON:  Dr. Jaynie Collins ASSISTANT: Dr Candelaria Celeste ANESTHESIOLOGIST: Dr Willa Frater  INDICATIONS: 21 y.o. G1P0 at [redacted]w[redacted]d here for with ruptured ectopic pregnancy. On exam, she had stable vital signs, and an acute abdomen. Hgb 10.0, blood type O+. Patient was counseled regarding need for laparoscopic salpingectomy. Risks of surgery including bleeding which may require transfusion or reoperation, infection, injury to bowel or other surrounding organs, need for additional procedures including laparotomy and other postoperative/anesthesia complications were explained to patient.  Written informed consent was obtained.  FINDINGS:  Moderate amount of hemoperitoneum estimated to be about 200 mL of blood and clots.  Dilated right fallopian tube containing ectopic gestation. Omentum concerning for implantation of products of conception. Small normal appearing uterus, normal left fallopian tube, right ovary and left ovary.  ANESTHESIA: General INTRAVENOUS FLUIDS: 2000 ml ESTIMATED BLOOD LOSS: 200 ml URINE OUTPUT: 100 ml SPECIMENS: right fallopian tube containing ectopic gestation, omentum containing products of conception COMPLICATIONS: None immediate  PROCEDURE IN DETAIL:  The patient was taken to the operating room where general anesthesia was administered and was found to be adequate.  She was placed in the dorsal lithotomy position, and was prepped and draped in a sterile manner.  A Foley catheter was inserted into her bladder and attached to constant drainage and a uterine manipulator was then advanced into the uterus .  After an adequate timeout was performed, attention was then turned to the patient's abdomen where a 10-mm skin incision  was made on the umbilical fold.  The Veress needle was carefully introduced into the peritoneal cavity at a 45-degree angle into the abdominal wall.  Intraperitoneal placement was confirmed by drop in intraabdominal pressure with insufflation of carbon dioxide gas.  Adequate pneumoperitoneum was obtained, and the 10-mm trocar and sleeve were then advanced without difficulty into the abdomen where intraabdominal placement was confirmed by the laparoscope. A survey of the patient's pelvis and abdomen revealed the findings as above.  Bilateral lower quadrant ports were placed under direct visualization; 10-mm on the left and 5-mm on the right.  The 10-mm Nezhat suction irrigator was then used to suction the hemoperitoneum and irrigate the pelvis.  Attention was then turned to the right fallopian tube mildly dilated at the distal end. Exploration of the omentum revealed 2 areas concerning for implantation of the pregnancy. These areas were grasped and ligated from the remaining omentum using the Harmonic instrument. The specimen was placed in an EndoCatch bag and removed from the uterus intact. The adomen was reexamined and it was noted that there was continued bleeding from the right fallopian tube. The right fallopian was grasped and ligated from the underlying mesosalpinx and uterine attachment using the Harmonic instrument.  Good hemostasis was noted.  The specimen was removed from the abdomen intact. The abdomen was desufflated, and all instruments were removed.  The fascial incisions of both 10-mm sites were reapproximated with 0 Vicryl figure-of-eight stiches; and all skin incisions were closed with a 3-0 Vicryl subcuticular stitch. The patient tolerated the procedures well.  All instruments, needles, and sponge counts were correct x 2. The patient was taken to the recovery room in stable condition.    Erica Mccarthy 03/23/2017 9:45 PM

## 2017-03-23 NOTE — Anesthesia Preprocedure Evaluation (Signed)
Anesthesia Evaluation  Patient identified by MRN, date of birth, ID band Patient awake    Reviewed: Allergy & Precautions, NPO status , Patient's Chart, lab work & pertinent test results  Airway Mallampati: II  TM Distance: >3 FB Neck ROM: Full    Dental no notable dental hx.    Pulmonary neg pulmonary ROS,    Pulmonary exam normal breath sounds clear to auscultation       Cardiovascular negative cardio ROS Normal cardiovascular exam Rhythm:Regular Rate:Normal     Neuro/Psych negative neurological ROS  negative psych ROS   GI/Hepatic negative GI ROS, Neg liver ROS,   Endo/Other  negative endocrine ROS  Renal/GU negative Renal ROS  negative genitourinary   Musculoskeletal negative musculoskeletal ROS (+)   Abdominal   Peds negative pediatric ROS (+)  Hematology negative hematology ROS (+)   Anesthesia Other Findings   Reproductive/Obstetrics (+) Pregnancy                             Anesthesia Physical Anesthesia Plan  ASA: II and emergent  Anesthesia Plan: General   Post-op Pain Management:    Induction: Intravenous  PONV Risk Score and Plan: 4 or greater and Ondansetron, Dexamethasone, Midazolam, Scopolamine patch - Pre-op and Treatment may vary due to age or medical condition  Airway Management Planned: Oral ETT  Additional Equipment:   Intra-op Plan:   Post-operative Plan: Extubation in OR  Informed Consent: I have reviewed the patients History and Physical, chart, labs and discussed the procedure including the risks, benefits and alternatives for the proposed anesthesia with the patient or authorized representative who has indicated his/her understanding and acceptance.   Dental advisory given  Plan Discussed with: CRNA  Anesthesia Plan Comments:         Anesthesia Quick Evaluation

## 2017-03-23 NOTE — Anesthesia Procedure Notes (Signed)
Procedure Name: Intubation Date/Time: 03/23/2017 8:29 PM Performed by: Graciela Husbands Pre-anesthesia Checklist: Patient identified, Emergency Drugs available, Suction available, Patient being monitored and Timeout performed Patient Re-evaluated:Patient Re-evaluated prior to induction Oxygen Delivery Method: Circle system utilized Preoxygenation: Pre-oxygenation with 100% oxygen Induction Type: IV induction, Cricoid Pressure applied and Rapid sequence Tube type: Oral Number of attempts: 1 Airway Equipment and Method: Stylet Placement Confirmation: ETT inserted through vocal cords under direct vision,  positive ETCO2 and breath sounds checked- equal and bilateral Secured at: 21 cm Tube secured with: Tape Dental Injury: Teeth and Oropharynx as per pre-operative assessment

## 2017-03-23 NOTE — MAU Provider Note (Addendum)
History     CSN: [id number]  Arrival date and time: 03/23/17 1641   None     Chief Complaint  Patient presents with  . Ectopic Pregnancy   HPI Patient is a G1P0 who had an viability scan earlier today that shows a right sided ectopic pregnancy measuring 2.3 x 2.5 x 2.0 cm. Moderate free fluid in pelvis. She has no other complaints, although noticed right lower quadrant tenderness afterwards. No radiation, no palliating or provoking factors. This is not a wanted pregnancy.    OB History    Gravida Para Term Preterm AB Living   1             SAB TAB Ectopic Multiple Live Births                  Past Medical History:  Diagnosis Date  . Anxiety   . Depression    not on meds, doing ok, has good support    Past Surgical History:  Procedure Laterality Date  . NO PAST SURGERIES      Family History  Problem Relation Age of Onset  . Diabetes Paternal Grandfather   . Hypertension Paternal Grandfather   . Diabetes Father   . Aneurysm Paternal Grandmother     Social History  Substance Use Topics  . Smoking status: Never Smoker  . Smokeless tobacco: Never Used  . Alcohol use Yes     Comment: occasionally, none since +PREG    Allergies:  Allergies  Allergen Reactions  . Latex Rash    Long term exposure.    Prescriptions Prior to Admission  Medication Sig Dispense Refill Last Dose  . metroNIDAZOLE (FLAGYL) 500 MG tablet Take 1 tablet (500 mg total) by mouth 2 (two) times daily. (Patient not taking: Reported on 03/16/2017) 14 tablet 0 Not Taking  . Norethindrone-Ethinyl Estradiol-Fe Biphas (LO LOESTRIN FE) 1 MG-10 MCG / 10 MCG tablet Take 1 tablet by mouth daily. Take 1 tablet by mouth daily. (Patient not taking: Reported on 03/16/2017) 3 Package 4 Not Taking  . permethrin (ELIMITE) 5 % cream Apply from the neck down at bedtime. In the morning wash off. Repeat in 1 week if needed (Patient not taking: Reported on 03/16/2017) 60 g 0 Not Taking    Review of  Systems Physical Exam   Blood pressure 106/67, pulse 74, temperature 98.4 F (36.9 C), temperature source Oral, resp. rate 16, weight 141 lb 4 oz (64.1 kg), last menstrual period 02/28/2017, SpO2 100 %.  Physical Exam  Constitutional: She is oriented to person, place, and time. She appears well-developed and well-nourished.  HENT:  Head: Normocephalic and atraumatic.  Right Ear: External ear normal.  Left Ear: External ear normal.  Eyes: Pupils are equal, round, and reactive to light. Conjunctivae are normal.  Neck: Normal range of motion.  Cardiovascular: Normal rate and regular rhythm.   Respiratory: Effort normal.  GI: Soft. She exhibits no distension. There is tenderness (RLQ TTP). There is no rebound and no guarding.  Neurological: She is alert and oriented to person, place, and time.  Skin: Skin is warm and dry.  Psychiatric: She has a normal mood and affect. Her behavior is normal. Judgment and thought content normal.   CBC    Component Value Date/Time   WBC 5.2 03/23/2017 1729   RBC 3.36 (L) 03/23/2017 1729   HGB 10.0 (L) 03/23/2017 1729   HGB 11.7 03/25/2016 1535   HCT 28.9 (L) 03/23/2017 1729   HCT 36.0 03/25/2016  1535   PLT 252 03/23/2017 1729   PLT 247 03/25/2016 1535   MCV 86.0 03/23/2017 1729   MCV 87 03/25/2016 1535   MCH 29.8 03/23/2017 1729   MCHC 34.6 03/23/2017 1729   RDW 13.0 03/23/2017 1729   RDW 14.2 03/25/2016 1535   LYMPHSABS 1.4 03/25/2016 1535   EOSABS 0.1 03/25/2016 1535   BASOSABS 0.0 03/25/2016 1535    US Ob Comp Less 14 Wks  Result Date: 03/23/2017 CLINICAL DATA:  Establish gestational age EXAM: OBSTETRIC <14 WK [place] AND TRANSVAGINAL OB US TECHNIQUE: Both transabdominal and transvaginal ultrasound examinations were performed for complete evaluation of the gestation as well as the maternal uterus, adnexal regions, and pelvic cul-de-sac. Transvaginal technique was performed to assess early pregnancy. COMPARISON:  None. FINDINGS: Intrauterine  gestational sac: None visualized Yolk sac:  Not visualized Embryo:  Not visualized Cardiac Activity: Heart Rate:   bpm MSD:   mm    w     d CRL:    mm    w    d                  [place] EDC: Subchorionic hemorrhage:  N/A Maternal uterus/adnexae: Corpus luteal cyst in the right ovary. Complex right adnexal mass superior to the right ovary measures 2.3 x 2.5 x 2.0 cm. Cannot exclude ectopic pregnancy. Moderate free fluid in the pelvis. IMPRESSION: Findings concerning for ectopic pregnancy in the right adnexa. Moderate free fluid. Critical Value/emergent results were called by telephone at the time of interpretation on 03/23/2017 at 3:56 pm to Dr. Candelaria Celeste , who verbally acknowledged these results. Electronically Signed   By: Charlett Nose M.D.   On: 03/23/2017 16:01   US Ob Transvaginal  Result Date: 03/23/2017 CLINICAL DATA:  Establish gestational age EXAM: OBSTETRIC <14 WK [place] AND TRANSVAGINAL OB US TECHNIQUE: Both transabdominal and transvaginal ultrasound examinations were performed for complete evaluation of the gestation as well as the maternal uterus, adnexal regions, and pelvic cul-de-sac. Transvaginal technique was performed to assess early pregnancy. COMPARISON:  None. FINDINGS: Intrauterine gestational sac: None visualized Yolk sac:  Not visualized Embryo:  Not visualized Cardiac Activity: Heart Rate:   bpm MSD:   mm    w     d CRL:    mm    w    d                  [place] EDC: Subchorionic hemorrhage:  N/A Maternal uterus/adnexae: Corpus luteal cyst in the right ovary. Complex right adnexal mass superior to the right ovary measures 2.3 x 2.5 x 2.0 cm. Cannot exclude ectopic pregnancy. Moderate free fluid in the pelvis. IMPRESSION: Findings concerning for ectopic pregnancy in the right adnexa. Moderate free fluid. Critical Value/emergent results were called by telephone at the time of interpretation on 03/23/2017 at 3:56 pm to Dr. Candelaria Celeste , who verbally acknowledged these results. Electronically Signed    By: Charlett Nose M.D.   On: 03/23/2017 16:01   MAU Course  Procedures  MDM Hg down 1.7 from last year, I am concerned about ruptured ectopic. I discussed this with patient, including surgical option. Risks discussed with patient including: infection, bleeding, injury to intestines and bladder, blood clots, pain.  Pt NPO since 10am. Continue NPO status.  Assessment and Plan  1. Ectopic pregnancy To OR when ready.  [name] 03/23/2017, 5:39 PM

## 2017-03-23 NOTE — MAU Note (Signed)
Found out she was preg about a month ago.  Sent from clinic, had an [place] today.  Needs further eval, was told preg is in fallopian tube.  No pain or bleeding.

## 2017-03-23 NOTE — Discharge Instructions (Signed)
DISCHARGE INSTRUCTIONS: laparoscopic surgery for ruptured ectopic pregnancy  The following instructions have been prepared to help you care for yourself upon your return home.   **You may begin taking Ibuprofen(Advil, Motrin) after 4:30 am today**  Personal hygiene:  Use sanitary pads for vaginal drainage, not tampons.  Shower the day after your procedure.  NO tub baths, pools or Jacuzzis for 2-3 weeks.  Wipe front to back after using the bathroom.  Activity and limitations:  Do NOT drive or operate any equipment for 24 hours. The effects of anesthesia are still present and drowsiness may result.  Do NOT rest in bed all day.  Walking is encouraged.  Walk up and down stairs slowly.  You may resume your normal activity in one to two days or as indicated by your physician.  Sexual activity: NO intercourse for at least 2 weeks after the procedure, or as indicated by your physician.  Diet: Eat a light meal as desired this evening. You may resume your usual diet tomorrow.  Return to work: You may resume your work activities in one to two days or as indicated by your doctor.  What to expect after your surgery: Expect to have vaginal bleeding/discharge for 2-3 days and spotting for up to 10 days. It is not unusual to have soreness for up to 1-2 weeks. You may have a slight burning sensation when you urinate for the first day. Mild cramps may continue for a couple of days. You may have a regular period in 2-6 weeks.  Call your doctor for any of the following:  Excessive vaginal bleeding, saturating and changing one pad every hour.  Inability to urinate 6 hours after discharge from hospital.  Pain not relieved by pain medication.  Fever of 100.4 F or greater.  Unusual vaginal discharge or odor.   Patients signature: ______________________  Nurses signature ________________________  Support person's signature_______________________

## 2017-03-23 NOTE — Transfer of Care (Signed)
Immediate Anesthesia Transfer of Care Note  Patient: Erica Mccarthy  Procedure(s) Performed: Procedure(s): DIAGNOSTIC LAPAROSCOPY WITH REMOVAL OF ECTOPIC PREGNANCY AND OMENTAL TISSUE, RIGHT SALPINGECTOMY (N/A)  Patient Location: PACU  Anesthesia Type:General  Level of Consciousness: awake, alert  and oriented  Airway & Oxygen Therapy: Patient Spontanous Breathing and Patient connected to nasal cannula oxygen  Post-op Assessment: Report given to RN and Post -op Vital signs reviewed and stable  Post vital signs: Reviewed and stable  Last Vitals:  Vitals:   03/23/17 1653  BP: 106/67  Pulse: 74  Resp: 16  Temp: 36.9 C  SpO2: 100%    Last Pain:  Vitals:   03/23/17 1653  TempSrc: Oral         Complications: No apparent anesthesia complications

## 2017-03-24 ENCOUNTER — Telehealth: Payer: Self-pay | Admitting: General Practice

## 2017-03-24 ENCOUNTER — Encounter (HOSPITAL_COMMUNITY): Payer: Self-pay | Admitting: Obstetrics & Gynecology

## 2017-03-24 DIAGNOSIS — O00101 Right tubal pregnancy without intrauterine pregnancy: Secondary | ICD-10-CM

## 2017-03-24 DIAGNOSIS — K661 Hemoperitoneum: Secondary | ICD-10-CM

## 2017-03-24 LAB — RPR: RPR: NONREACTIVE

## 2017-03-24 LAB — ABO/RH: ABO/RH(D): O POS

## 2017-03-24 LAB — HIV ANTIBODY (ROUTINE TESTING W REFLEX): HIV SCREEN 4TH GENERATION: NONREACTIVE

## 2017-03-24 MED ORDER — OXYCODONE-ACETAMINOPHEN 5-325 MG PO TABS
1.0000 | ORAL_TABLET | ORAL | Status: DC | PRN
  Administered 2017-03-24: 1 via ORAL

## 2017-03-24 MED ORDER — OXYCODONE-ACETAMINOPHEN 5-325 MG PO TABS
ORAL_TABLET | ORAL | Status: AC
  Filled 2017-03-24: qty 1

## 2017-03-24 NOTE — Telephone Encounter (Signed)
Called and left message on VM in regards to post op appointment on 04/13/17 at 1240 with Dr. Macon Large.  Asked patient to give our office a call if unable to keep this appointment.

## 2017-03-25 NOTE — Anesthesia Postprocedure Evaluation (Signed)
Anesthesia Post Note  Patient: Erica Mccarthy  Procedure(s) Performed: Procedure(s) (LRB): DIAGNOSTIC LAPAROSCOPY WITH REMOVAL OF ECTOPIC PREGNANCY AND OMENTAL TISSUE, RIGHT SALPINGECTOMY (N/A)     Patient location during evaluation: PACU Anesthesia Type: General Level of consciousness: awake and alert Pain management: pain level controlled Vital Signs Assessment: post-procedure vital signs reviewed and stable Respiratory status: spontaneous breathing, nonlabored ventilation, respiratory function stable and patient connected to nasal cannula oxygen Cardiovascular status: blood pressure returned to baseline and stable Postop Assessment: no signs of nausea or vomiting Anesthetic complications: no    Last Vitals:  Vitals:   03/24/17 0015 03/24/17 0130  BP: 99/62 102/60  Pulse: 60 76  Resp: 13 14  Temp:  36.7 C  SpO2: 100% 100%    Last Pain:  Vitals:   03/24/17 0130  TempSrc:   PainSc: 2    Pain Goal:                 Phillips Groutarignan, Bekah Igoe

## 2017-04-13 ENCOUNTER — Ambulatory Visit (INDEPENDENT_AMBULATORY_CARE_PROVIDER_SITE_OTHER): Payer: Medicaid Other | Admitting: Obstetrics & Gynecology

## 2017-04-13 ENCOUNTER — Encounter: Payer: Self-pay | Admitting: Obstetrics & Gynecology

## 2017-04-13 VITALS — BP 101/58 | HR 70 | Ht 64.0 in | Wt 137.9 lb

## 2017-04-13 DIAGNOSIS — O00101 Right tubal pregnancy without intrauterine pregnancy: Principal | ICD-10-CM

## 2017-04-13 DIAGNOSIS — Z09 Encounter for follow-up examination after completed treatment for conditions other than malignant neoplasm: Secondary | ICD-10-CM

## 2017-04-13 DIAGNOSIS — K661 Hemoperitoneum: Secondary | ICD-10-CM

## 2017-04-13 NOTE — Progress Notes (Signed)
Subjective:     Erica Mccarthy is a 21 y.o. female who presents to the clinic 3 weeks status post laparoscopic right salpingectomy and removal of ectopic pregnancy for ruptured ectopic surgery. Eating a regular diet without difficulty. Bowel movements are normal. The patient is not having any pain.  The following portions of the patient's history were reviewed and updated as appropriate: allergies, current medications, past family history, past medical history, past social history, past surgical history and problem list.  Normal pap in 2017.  Has received HPV vaccine series.   Review of Systems Pertinent items are noted in HPI.    Objective:    BP (!) 101/58   Pulse 70   Ht  (1.626 m)   Wt 137 lb 14.4 oz (62.6 kg)   LMP  (LMP Unknown)   Breastfeeding? Unknown   BMI 23.67 kg/m  General:  alert and no distress  Abdomen: soft, bowel sounds active, non-tender  Incision:   healing well, no drainage, no erythema, no hernia, no seroma, no swelling, no dehiscence, incision well approximated    03/23/17 Surgical Pathology Diagnosis Fallopian tube, ectopic pregnancy, right - FINDINGS CONSISTENT WITH ECTOPIC PREGNANCY  Assessment:    Doing well postoperatively. Operative findings again reviewed. Pathology report discussed.    Plan:   1. Continue any current medications. 2. Wound care discussed. 3. Activity restrictions: none 4. Anticipated return to work: not applicable. 5. Condoms and fertility awareness for contraception given reaction to OCPs in the past. 6. Follow up as needed.   Jaynie Collins, MD, FACOG Attending Obstetrician & Gynecologist, [facility] for [employer], [facility]

## 2017-04-13 NOTE — Patient Instructions (Signed)
Return to clinic for any scheduled appointments or for any gynecologic concerns as needed.   

## 2017-04-21 ENCOUNTER — Encounter: Payer: Medicaid Other | Admitting: Family Medicine

## 2017-04-30 ENCOUNTER — Inpatient Hospital Stay (HOSPITAL_COMMUNITY)
Admission: AD | Admit: 2017-04-30 | Discharge: 2017-04-30 | Disposition: A | Discharge status: Home / Self Care | Payer: Medicaid Other | Source: Ambulatory Visit | Attending: Obstetrics & Gynecology | Admitting: Obstetrics & Gynecology

## 2017-04-30 ENCOUNTER — Encounter (HOSPITAL_COMMUNITY): Payer: Self-pay | Admitting: *Deleted

## 2017-04-30 DIAGNOSIS — L089 Local infection of the skin and subcutaneous tissue, unspecified: Secondary | ICD-10-CM

## 2017-04-30 DIAGNOSIS — T8189XA Other complications of procedures, not elsewhere classified, initial encounter: Secondary | ICD-10-CM | POA: Insufficient documentation

## 2017-04-30 DIAGNOSIS — T148XXA Other injury of unspecified body region, initial encounter: Secondary | ICD-10-CM

## 2017-04-30 MED ORDER — CEPHALEXIN 500 MG PO CAPS: 500.0000 mg | ORAL_CAPSULE | Freq: Four times a day (QID) | ORAL | 0 refills | Status: DC

## 2017-04-30 NOTE — MAU Note (Addendum)
Patient presented with pain at the incisional site, ectopic procedure preformed on August 28th .  Pain 2-3/10.

## 2017-04-30 NOTE — Discharge Instructions (Signed)

## 2017-04-30 NOTE — MAU Note (Signed)
Pt. Discharged to home.  E-signature not functioning. Signature obtained on hard copy, copy filed with paperwork and sent to medical records.

## 2017-04-30 NOTE — MAU Provider Note (Addendum)
Chief Complaint:  No chief complaint on file.   First Provider Initiated Contact with Patient 04/30/17 1624      HPI: Erica Mccarthy is a 21 y.o. G1P0 who presents to maternity admissions reporting small amount of clear drainage from umbilical incision and LLQ laparoscopy incision "blew up and was red", so she "sterilized a needle and opened it, and squeezed a bunch of stuff out".  She reports no vaginal bleeding, vaginal itching/burning, urinary symptoms, h/a, dizziness, n/v, or fever/chills.    Had a laparoscopy on 03/23/17 for an ectopic pregnancy  Other  This is a new problem. The current episode started in the past 7 days. The problem occurs rarely. The problem has been rapidly improving. Pertinent negatives include no abdominal pain, chest pain, chills, fever, nausea or vomiting. Nothing aggravates the symptoms. Treatments tried: drained cyst herself. The treatment provided significant relief.    RN Note: Patient presented with pain at the incisional site, ectopic procedure preformed on August 28th .  Pain 2-3/10.   Past Medical History: Past Medical History:  Diagnosis Date  . Anxiety   . Depression    not on meds, doing ok, has good support    Past obstetric history: OB History  Gravida Para Term Preterm AB Living  1            SAB TAB Ectopic Multiple Live Births               # Outcome Date GA Lbr Len/2nd Weight Sex Delivery Anes PTL Lv  1 Gravida               Past Surgical History: Past Surgical History:  Procedure Laterality Date  . DIAGNOSTIC LAPAROSCOPY WITH REMOVAL OF ECTOPIC PREGNANCY N/A 03/23/2017   Procedure: DIAGNOSTIC LAPAROSCOPY WITH REMOVAL OF ECTOPIC PREGNANCY AND OMENTAL TISSUE, RIGHT SALPINGECTOMY;  Surgeon: Tereso Newcomer, MD;  Location: [facility];  Service: Gynecology;  Laterality: N/A;  . NO PAST SURGERIES      Family History: Family History  Problem Relation Age of Onset  . Diabetes Paternal Grandfather   . Hypertension Paternal Grandfather    . Diabetes Father   . Aneurysm Paternal Grandmother     Social History: Social History  Substance Use Topics  . Smoking status: Never Smoker  . Smokeless tobacco: Never Used  . Alcohol use Yes     Comment: occasionally, none since +PREG    Allergies:  Allergies  Allergen Reactions  . Latex Rash    Long term exposure.    Meds:  Prescriptions Prior to Admission  Medication Sig Dispense Refill Last Dose  . ibuprofen (ADVIL,MOTRIN) 600 MG tablet Take 1 tablet (600 mg total) by mouth every 6 (six) hours as needed. (Patient not taking: Reported on 04/13/2017) 30 tablet 0 Not Taking  . oxyCODONE-acetaminophen (PERCOCET/ROXICET) 5-325 MG tablet Take 1-2 tablets by mouth every 6 (six) hours as needed. (Patient not taking: Reported on 04/13/2017) 20 tablet 0 Not Taking    I have reviewed patient's Past Medical Hx, Surgical Hx, Family Hx, Social Hx, medications and allergies.  ROS:  Review of Systems  Constitutional: Negative for chills and fever.  Cardiovascular: Negative for chest pain.  Gastrointestinal: Negative for abdominal pain, nausea and vomiting.   Other systems negative     Physical Exam  No data found.  Constitutional: Well-developed, well-nourished female in no acute distress.  Cardiovascular: normal rate and rhythm, no ectopy audible, S1 & S2 heard, no murmur Respiratory: normal effort, no distress. Lungs  CTAB with no wheezes or crackles  GI: Abd soft, non-tender.  Nondistended.  No rebound, No guarding.  Bowel Sounds audible       Umbilical incision healed, not draining      Small left lower quadrant incision, healed, slightly thickened,  NO erethema or drainage  MS: Extremities nontender, no edema, normal ROM Neurologic: Alert and oriented x 4.   Grossly nonfocal. GU: Neg CVAT. Skin:  Warm and Dry Psych:  Affect appropriate.  PELVIC EXAM: deferred   Labs: No results found for this or any previous visit (from the past 24 hour(s)). --/--/O POS, O POS  (08/28 1729)  Imaging:  No results found.  MAU Course/MDM: I have ordered labs as follows: none Imaging ordered: none    Treatments in MAU included none  Discussed wound healing .  Discussed the infection she had appears to be resolved.  Warned her not to stick needles in incisions.  If it gets red again, call clinic for exam.    Pt stable at time of discharge.  Assessment: No diagnosis found.  Plan: Discharge home Recommend Normal wound care Rx Keflex for prevention of infection since she opened it  Encouraged to return here or to other Urgent Care/ED if she develops worsening of symptoms, increase in pain, fever, or other concerning symptoms.   Wynelle Bourgeois CNM, MSN Certified Nurse-Midwife 04/30/2017 4:24 PM

## 2017-07-23 ENCOUNTER — Other Ambulatory Visit: Payer: Self-pay

## 2017-07-23 ENCOUNTER — Inpatient Hospital Stay (HOSPITAL_COMMUNITY)
Admission: AD | Admit: 2017-07-23 | Discharge: 2017-07-23 | Disposition: A | Discharge status: Home / Self Care | Payer: Medicaid Other | Source: Ambulatory Visit | Attending: Obstetrics & Gynecology | Admitting: Obstetrics & Gynecology

## 2017-07-23 ENCOUNTER — Encounter (HOSPITAL_COMMUNITY): Payer: Self-pay | Admitting: Obstetrics and Gynecology

## 2017-07-23 DIAGNOSIS — R1032 Left lower quadrant pain: Secondary | ICD-10-CM | POA: Insufficient documentation

## 2017-07-23 DIAGNOSIS — Z3202 Encounter for pregnancy test, result negative: Secondary | ICD-10-CM | POA: Diagnosis not present

## 2017-07-23 DIAGNOSIS — R102 Pelvic and perineal pain: Secondary | ICD-10-CM

## 2017-07-23 LAB — URINALYSIS, ROUTINE W REFLEX MICROSCOPIC
Bilirubin Urine: NEGATIVE
GLUCOSE, UA: NEGATIVE mg/dL
Hgb urine dipstick: NEGATIVE
Ketones, ur: NEGATIVE mg/dL
LEUKOCYTES UA: NEGATIVE
Nitrite: NEGATIVE
PH: 5 (ref 5.0–8.0)
Protein, ur: NEGATIVE mg/dL
Specific Gravity, Urine: 1.017 (ref 1.005–1.030)

## 2017-07-23 LAB — POCT PREGNANCY, URINE: Preg Test, Ur: NEGATIVE

## 2017-07-23 MED ORDER — TRAMADOL HCL 50 MG PO TABS: 50.0000 mg | ORAL_TABLET | Freq: Four times a day (QID) | ORAL | 0 refills | Status: DC | PRN

## 2017-07-23 NOTE — MAU Note (Signed)
A week ago had some period like cramps in LLQ of abdomen. Pain is still there and worse now. Took preg test x2 and negative. Had ectopic in R tube in Sept and had surgery. Has noticed nipple color changes. Worried about poss ectopic in L tube since that is only remaining tube. Denies vag bleeding

## 2017-07-23 NOTE — Discharge Instructions (Signed)
In late 2019, the [facility] will be moving to the [place] campus. At that time, the MAU (Maternity Admissions Unit), where you are being seen today, will no longer take care of non-pregnant patients. We strongly encourage you to find a doctor's office before that time, so that you can be seen with any GYN concerns, like vaginal discharge, urinary tract infection, etc.. in a timely manner. ° °In order to make an office visit more convenient, the [facility] will be offering evening hours with same-day appointments, walk-in appointments and scheduled appointments available during this time. ° °[facility] Hours: °Monday - 8am - 7:30 pm with walk-in between 4pm- 7:30 pm °Tuesday - 8 am - 5 pm (starting 10/26/17 we will be open late and accepting walk-ins from 4pm - 7:30pm) °Wednesday - 8 am - 5 pm (starting 01/26/18 we will be open late and accepting walk-ins from 4pm - 7:30pm) °Thursday 8 am - 5 pm (starting 04/28/18 we will be open late and accepting walk-ins from 4pm - 7:30pm) °Friday 8 am - 5 pm ° °For an appointment please call the [facility] at [phone] ° °For urgent needs, [facility] is also available for management of urgent GYN complaints such as vaginal discharge or urinary tract infections. ° ° ° ° ° °

## 2017-07-23 NOTE — MAU Provider Note (Signed)
Ms.  Erica Mccarthy is a 21 y.o. year old G1P0 female non-pregnant who presents to MAU reporting 2 negative HPT, cramping in the LLQ of abdomen x 1 week. She has a h/o ectopic pregnancy in her RT tube, had RT salpingectomy (03/2017). She is worried that this is she could be pregnant with another ectopic pregnancy on the LT side. She denies VB. Her LMP was 07/01/2017.  Advised of negative UPT here and that she could not have an ectopic pregnancy with a negative UPT.   Medical screen completed -- patient found to be medically stable. Patient Vitals for the past 24 hrs:  BP Temp Pulse Resp SpO2 Height Weight  07/23/17 1918 - - - - 100 % - -  07/23/17 1913 113/67 98.8 F (37.1 C) 67 18 - 5\' 4"  (1.626 m) 141 lb (64 kg)   [facility] UPT  Results for orders placed or performed during the hospital encounter of 07/23/17 (from the past 24 hour(s))  Urinalysis, Routine w reflex microscopic     Status: None   Collection Time: 07/23/17  7:20 PM  Result Value Ref Range   Color, Urine YELLOW YELLOW   APPearance CLEAR CLEAR   Specific Gravity, Urine 1.017 1.005 - 1.030   pH 5.0 5.0 - 8.0   Glucose, UA NEGATIVE NEGATIVE mg/dL   Hgb urine dipstick NEGATIVE NEGATIVE   Bilirubin Urine NEGATIVE NEGATIVE   Ketones, ur NEGATIVE NEGATIVE mg/dL   Protein, ur NEGATIVE NEGATIVE mg/dL   Nitrite NEGATIVE NEGATIVE   Leukocytes, UA NEGATIVE NEGATIVE  Pregnancy, urine POC     Status: None   Collection Time: 07/23/17  7:41 PM  Result Value Ref Range   Preg Test, Ur NEGATIVE NEGATIVE    Pelvic pain - LT - Advised to take ibuprofen 800 mg every 8 hrs prn pain - Rx for Ultram 50 mg every 6 hrs prn moderate to severe pain - Information provided on pelvic pain and upcoming GYN changes - Advised the pain she is experiencing can be from an ovarian cyst - F/U with [facility] if pain is not relieved or worsens - Discharge home - Patient verbalized an understanding of the plan of care and agrees.   Raelyn MoraRolitta Kamani Magnussen,  CNM 07/23/2017 10:34 PM

## 2017-07-23 NOTE — Progress Notes (Signed)
Erica Mccarthy CNM in Triage earlier and discussed test results and d/c plan with pt. Written and verbal d/c instructions given and understanding voiced

## 2017-08-30 ENCOUNTER — Ambulatory Visit (INDEPENDENT_AMBULATORY_CARE_PROVIDER_SITE_OTHER): Payer: Medicaid Other

## 2017-08-30 ENCOUNTER — Encounter (HOSPITAL_COMMUNITY): Payer: Self-pay | Admitting: Emergency Medicine

## 2017-08-30 ENCOUNTER — Other Ambulatory Visit: Payer: Self-pay

## 2017-08-30 ENCOUNTER — Ambulatory Visit (HOSPITAL_COMMUNITY)
Admission: EM | Admit: 2017-08-30 | Discharge: 2017-08-30 | Disposition: A | Discharge status: Home / Self Care | Payer: Medicaid Other | Attending: Family Medicine | Admitting: Family Medicine

## 2017-08-30 DIAGNOSIS — M25562 Pain in left knee: Secondary | ICD-10-CM | POA: Diagnosis not present

## 2017-08-30 DIAGNOSIS — M25462 Effusion, left knee: Secondary | ICD-10-CM | POA: Diagnosis not present

## 2017-08-30 MED ORDER — MELOXICAM 7.5 MG PO TABS: 7.5000 mg | ORAL_TABLET | Freq: Every day | ORAL | 0 refills | Status: AC

## 2017-08-30 NOTE — ED Triage Notes (Signed)
[facility] a [facility] left knee pain x 3 days ago. The patient reported that she 'popped" her knee out of place and it went back in on its on but has become painful and swollen.

## 2017-08-30 NOTE — Discharge Instructions (Signed)
Xray negative for fracture or dislocation. As discussed, this does not rule out meniscus (cartilage) or ligament injury. Start mobic as directed. Ice compress and elevation for swelling. Knee sleeve during activity. You can continue to use the crutches and slowly weight bear as tolerated. Follow up with orthopedics for further evaluation.

## 2017-08-30 NOTE — ED Provider Notes (Signed)
[facility]    CSN: [id number] Arrival date & time: 08/30/17  1145     History   Chief Complaint Chief Complaint  Patient presents with  . Knee Pain    HPI Erica Mccarthy is a 22 y.o. female.   22 year old female with history of anxiety and depression comes in for 3-day history of left knee pain.  Patient states she was dancing when she twisted her knee, and states "her knee popped out of place", and then it went back in on its own.  States "I felt my knee dislocate".  States for the past 3 days it has been painful with diffuse swelling of the knee.  She has been wearing a knee brace and using crutches without relief.  States "I do not know what to take for it".  Denies numbness, tingling.  States pain is constant, but worse with weightbearing.      Past Medical History:  Diagnosis Date  . Anxiety   . Depression    not on meds, doing ok, has good support    Patient Active Problem List   Diagnosis Date Noted  . Pelvic pain 07/23/2017  . History of depression 03/25/2016    Past Surgical History:  Procedure Laterality Date  . DIAGNOSTIC LAPAROSCOPY WITH REMOVAL OF ECTOPIC PREGNANCY N/A 03/23/2017   Procedure: DIAGNOSTIC LAPAROSCOPY WITH REMOVAL OF ECTOPIC PREGNANCY AND OMENTAL TISSUE, RIGHT SALPINGECTOMY;  Surgeon: Tereso NewcomerAnyanwu, Ugonna A, MD;  Location: [facility];  Service: Gynecology;  Laterality: N/A;  . NO PAST SURGERIES      OB History    Gravida Para Term Preterm AB Living   1             SAB TAB Ectopic Multiple Live Births                   Home Medications    Prior to Admission medications   Medication Sig Start Date End Date Taking? Authorizing Provider  meloxicam (MOBIC) 7.5 MG tablet Take 1 tablet (7.5 mg total) by mouth daily. 08/30/17   Belinda FisherYu, Shalane Florendo V, PA-C    Family History Family History  Problem Relation Age of Onset  . Diabetes Paternal Grandfather   . Hypertension Paternal Grandfather   . Diabetes Father   . Aneurysm Paternal Grandmother      Social History Social History   Tobacco Use  . Smoking status: Never Smoker  . Smokeless tobacco: Never Used  Substance Use Topics  . Alcohol use: Yes    Comment: occasionally, none since +PREG  . Drug use: No     Allergies   Latex   Review of Systems Review of Systems  Reason unable to perform ROS: See HPI as above.     Physical Exam Triage Vital Signs ED Triage Vitals  Enc Vitals Group     BP 08/30/17 1317 109/70     Pulse Rate 08/30/17 1317 69     Resp 08/30/17 1317 18     Temp 08/30/17 1317 98.1 F (36.7 C)     Temp Source 08/30/17 1317 Oral     SpO2 08/30/17 1317 100 %     Weight --      Height --      Head Circumference --      Peak Flow --      Pain Score 08/30/17 1315 9     Pain Loc --      Pain Edu? --      Excl. in GC? --  No data found.  Updated Vital Signs BP 109/70 (BP Location: Left Arm)   Pulse 69   Temp 98.1 F (36.7 C) (Oral)   Resp 18   LMP 08/08/2017 (Exact Date)   SpO2 100%   Physical Exam  Constitutional: She is oriented to person, place, and time. She appears well-developed and well-nourished. No distress.  HENT:  Head: Normocephalic and atraumatic.  Eyes: Conjunctivae are normal. Pupils are equal, round, and reactive to light.  Musculoskeletal:  No obvious swelling, erythema, increased warmth, contusions.  Tenderness to light palpation of medial knee, especially medial joint line.  Decreased extension and flexion of the knee.  Strength normal and equal bilaterally.  Sensation intact and equal bilaterally.  Pedal pulses 2+ and equal bilaterally.  Deferred posterior/anterior drawer, valgus and varus stress test due to pain.  Neurological: She is alert and oriented to person, place, and time.     UC Treatments / Results  Labs (all labs ordered are listed, but only abnormal results are displayed) Labs Reviewed - No data to display  EKG  EKG Interpretation None       Radiology Dg Knee Complete 4 Views  Left  Result Date: 08/30/2017 CLINICAL DATA:  Left knee pain after fall 3 days ago. EXAM: LEFT KNEE - COMPLETE 4+ VIEW COMPARISON:  None. FINDINGS: No acute fracture or malalignment. Small suprapatellar joint effusion. Joint spaces are preserved. Bone mineralization is normal. Soft tissues are unremarkable. IMPRESSION: 1. Small suprapatellar joint effusion. No acute osseous abnormality. Electronically Signed   By: Obie Dredge M.D.   On: 08/30/2017 13:48    Procedures Procedures (including critical care time)  Medications Ordered in UC Medications - No data to display   Initial Impression / Assessment and Plan / UC Course  I have reviewed the triage vital signs and the nursing notes.  Pertinent labs & imaging results that were available during my care of the patient were reviewed by me and considered in my medical decision making (see chart for details).    X-ray negative for fracture or dislocation with small suprapatellar joint effusion.  Discussed with patient this does not rule out meniscus/ligament injury.  Start Mobic as directed.  Ice compress, elevation.  Knee sleeve during activity.  Patient can continue to use crutches slowly weight-bear as tolerated.  Follow-up with orthopedics for further evaluation and treatment needed.  Return precautions given.  Final Clinical Impressions(s) / UC Diagnoses   Final diagnoses:  Acute pain of left knee    ED Discharge Orders        Ordered    meloxicam (MOBIC) 7.5 MG tablet  Daily     08/30/17 1406        Belinda Fisher, [place] 08/30/17 1412

## 2017-08-30 NOTE — ED Notes (Signed)
Patient has a knee sleeve and crutches with her.

## 2017-09-07 ENCOUNTER — Ambulatory Visit: Payer: Medicaid Other | Admitting: Obstetrics & Gynecology

## 2017-11-14 IMAGING — US US OB COMP LESS 14 WK
1 series · 15 of 28 positions shown · non-contrast
Comparison: None.

CLINICAL DATA: Establish gestational age

EXAM:
OBSTETRIC <14 WK US AND TRANSVAGINAL OB US
TECHNIQUE: Both transabdominal and transvaginal ultrasound examinations were
performed for complete evaluation of the gestation as well as the
maternal uterus, adnexal regions, and pelvic cul-de-sac.
Transvaginal technique was performed to assess early pregnancy.

[Series 1: us ob comp less 14 wk · 97 acquisitions, 15 frames shown]
[im 1/97]
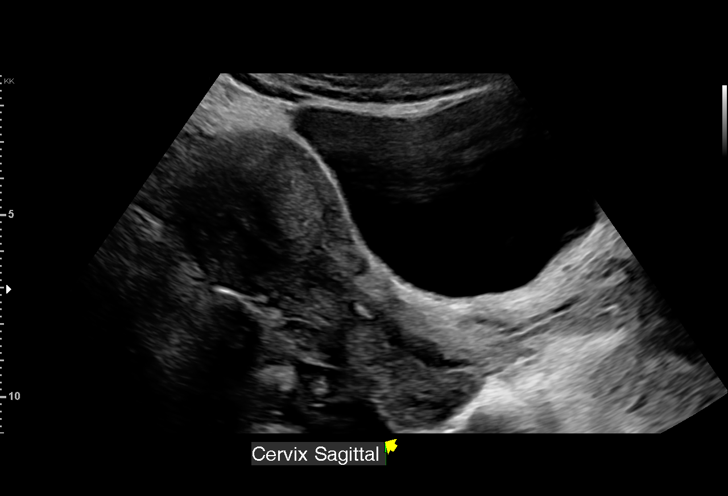
[im 8/97]
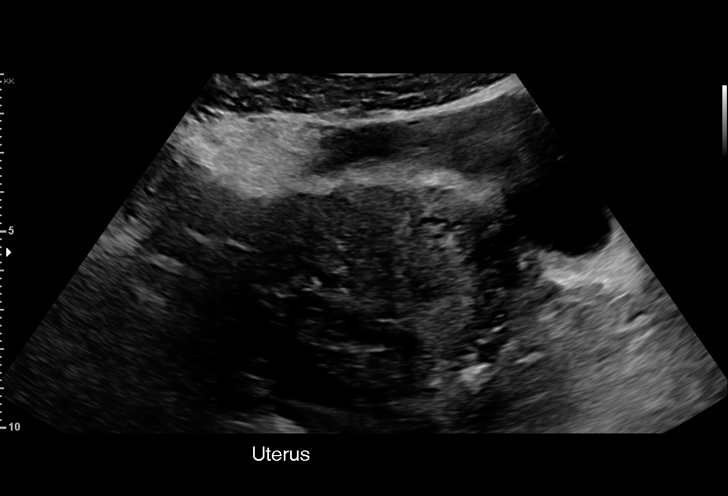
[im 15/97]
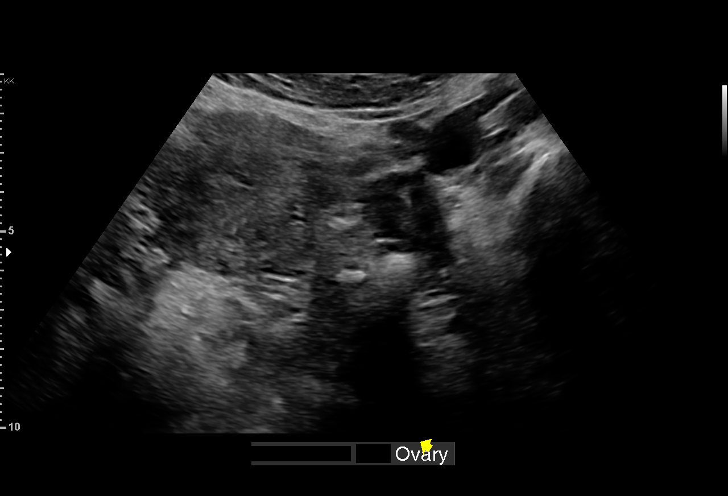
[im 22/97]
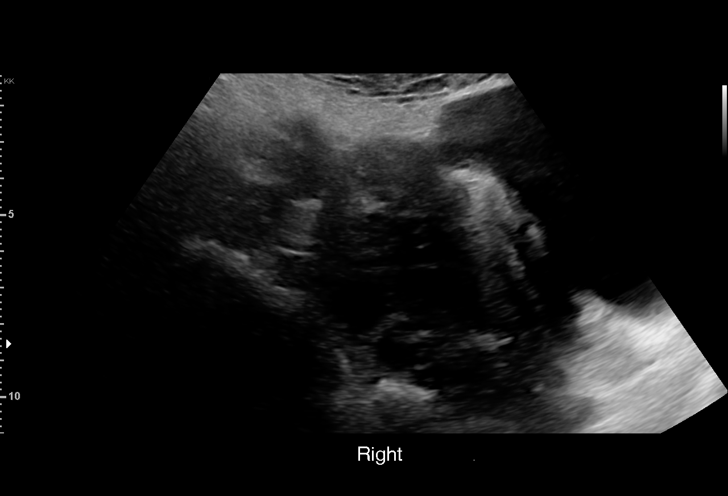
[im 29/97]
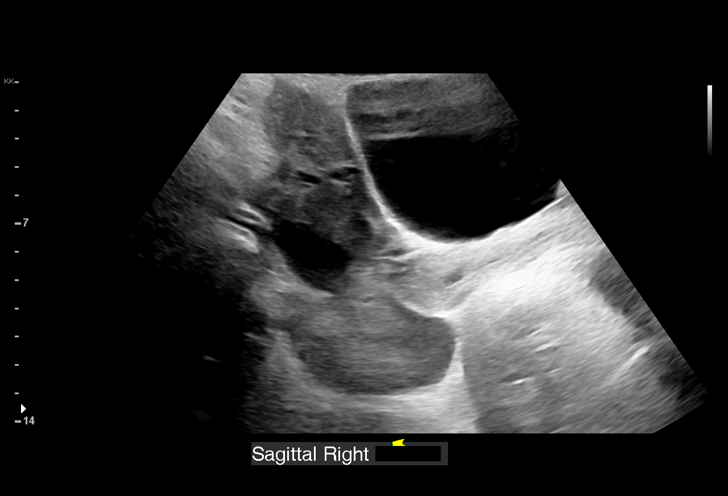
[im 36/97]
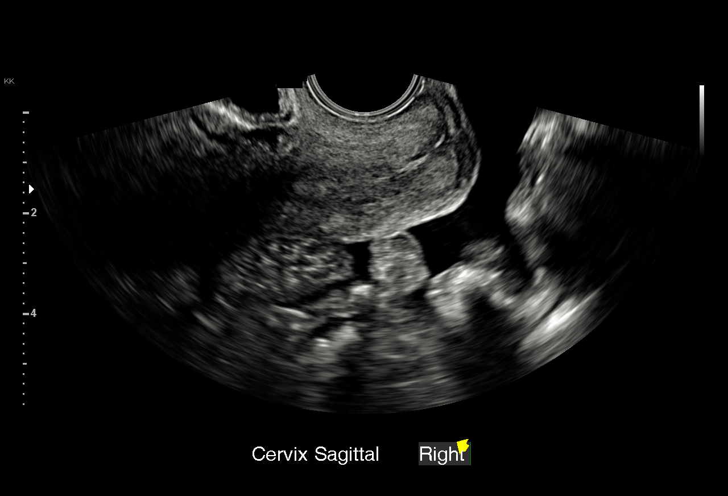
[im 43/97]
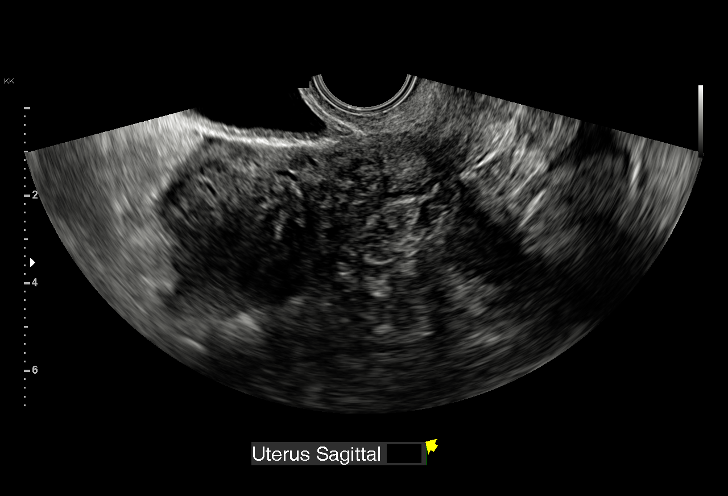
[im 50/97]
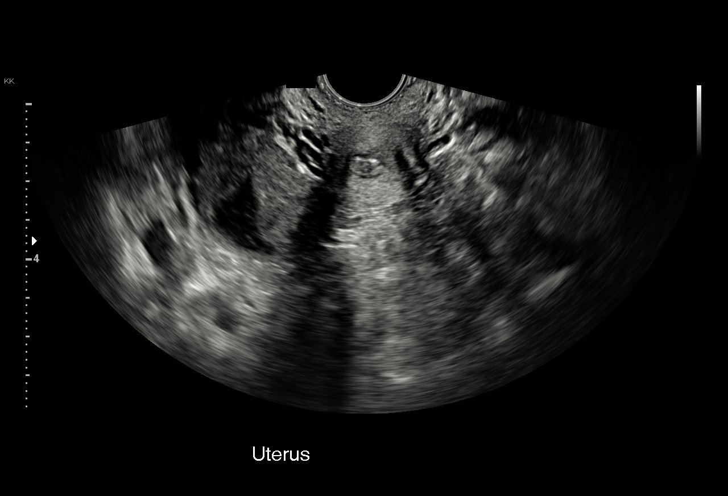
[im 54/97]
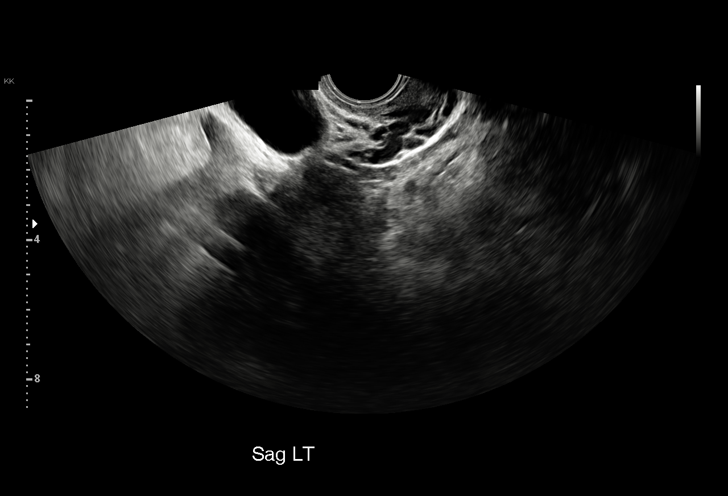
[im 61/97]
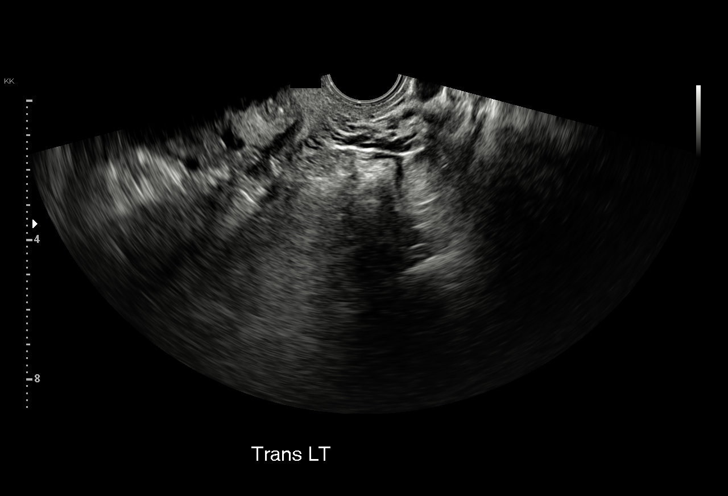
[im 68/97]
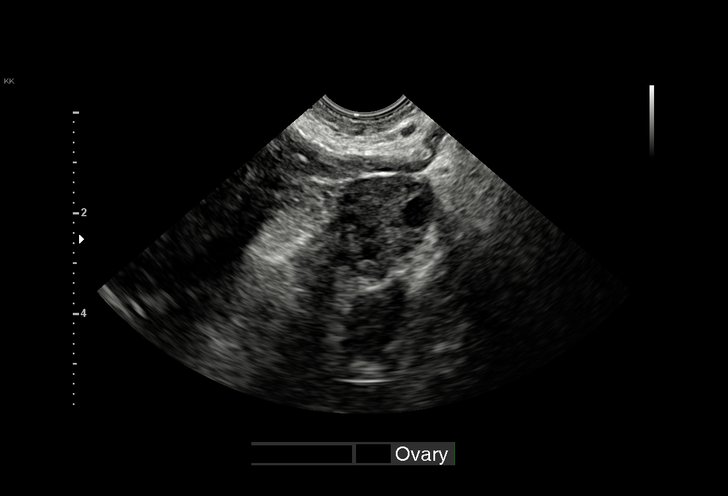
[im 75/97]
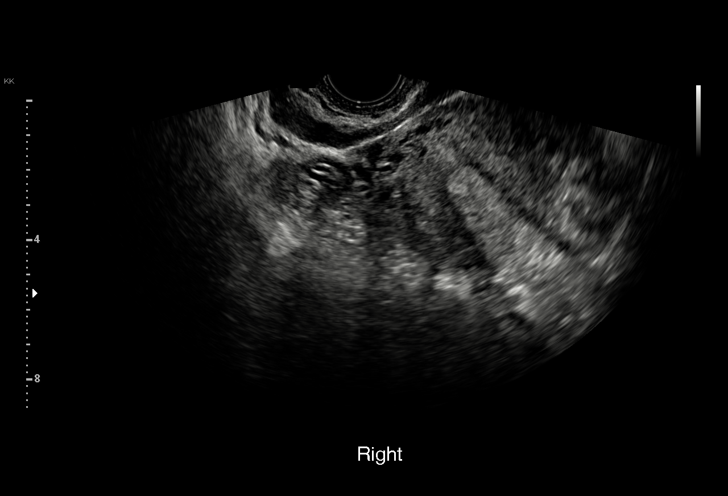
[im 82/97]
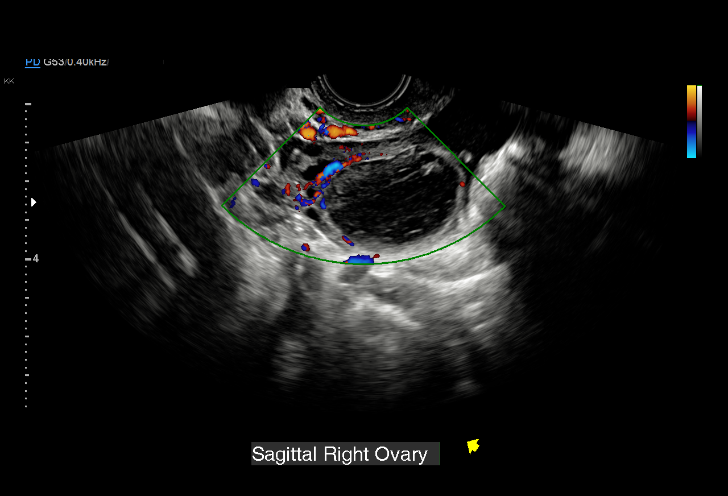
[im 89/97]
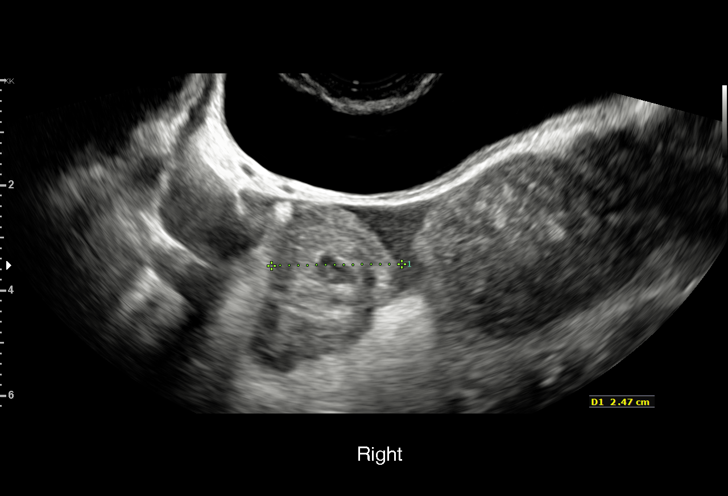
[im 97/97]
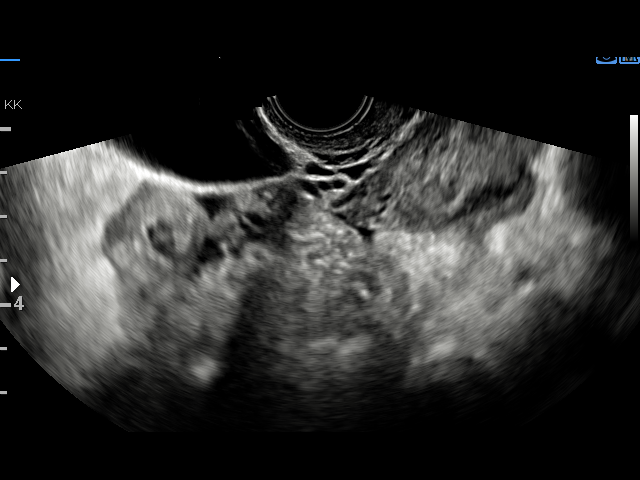

[15 of 28 positions shown; findings below may reference images not displayed]

FINDINGS: Intrauterine gestational sac: None visualized

Yolk sac:  Not visualized

Embryo:  Not visualized

Cardiac Activity:

Heart Rate:   bpm

MSD:   mm    w     d

CRL:    mm    w    d                  US EDC:

Subchorionic hemorrhage:  N/A

Maternal uterus/adnexae: Corpus luteal cyst in the right ovary.
Complex right adnexal mass superior to the right ovary measures
x 2.5 x 2.0 cm. Cannot exclude ectopic pregnancy. Moderate free
fluid in the pelvis.
IMPRESSION: Findings concerning for ectopic pregnancy in the right adnexa.
Moderate free fluid.

Critical Value/emergent results were called by telephone at the time
of interpretation on 03/23/2017 at [DATE] to Dr. HUN CHUL BYEON , who
verbally acknowledged these results.

## 2018-04-23 IMAGING — DX DG KNEE COMPLETE 4+V*L*
4 series · 4 of 4 positions shown · non-contrast
Comparison: None.

CLINICAL DATA: Left knee pain after fall 3 days ago.

EXAM:
LEFT KNEE - COMPLETE 4+ VIEW

[knee ap]
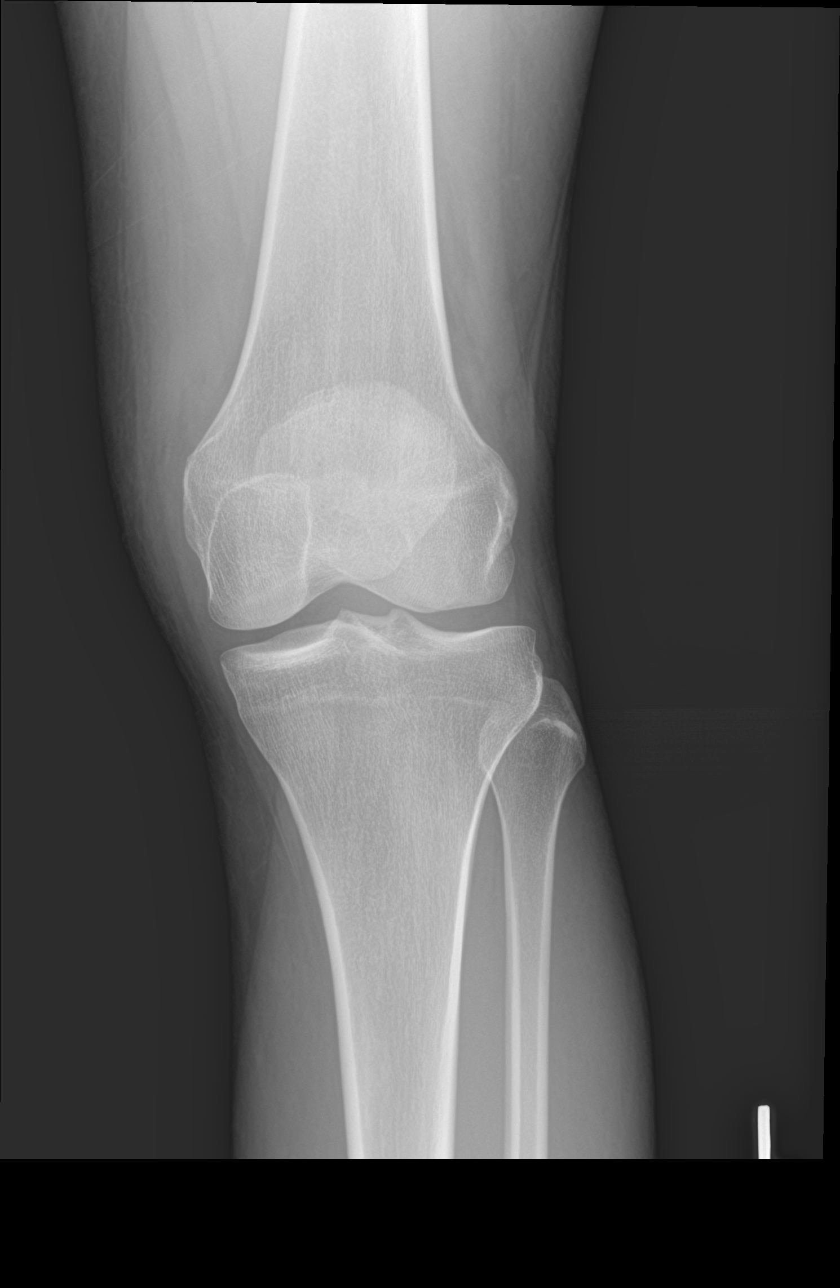

[knee obl (1 of 2)]
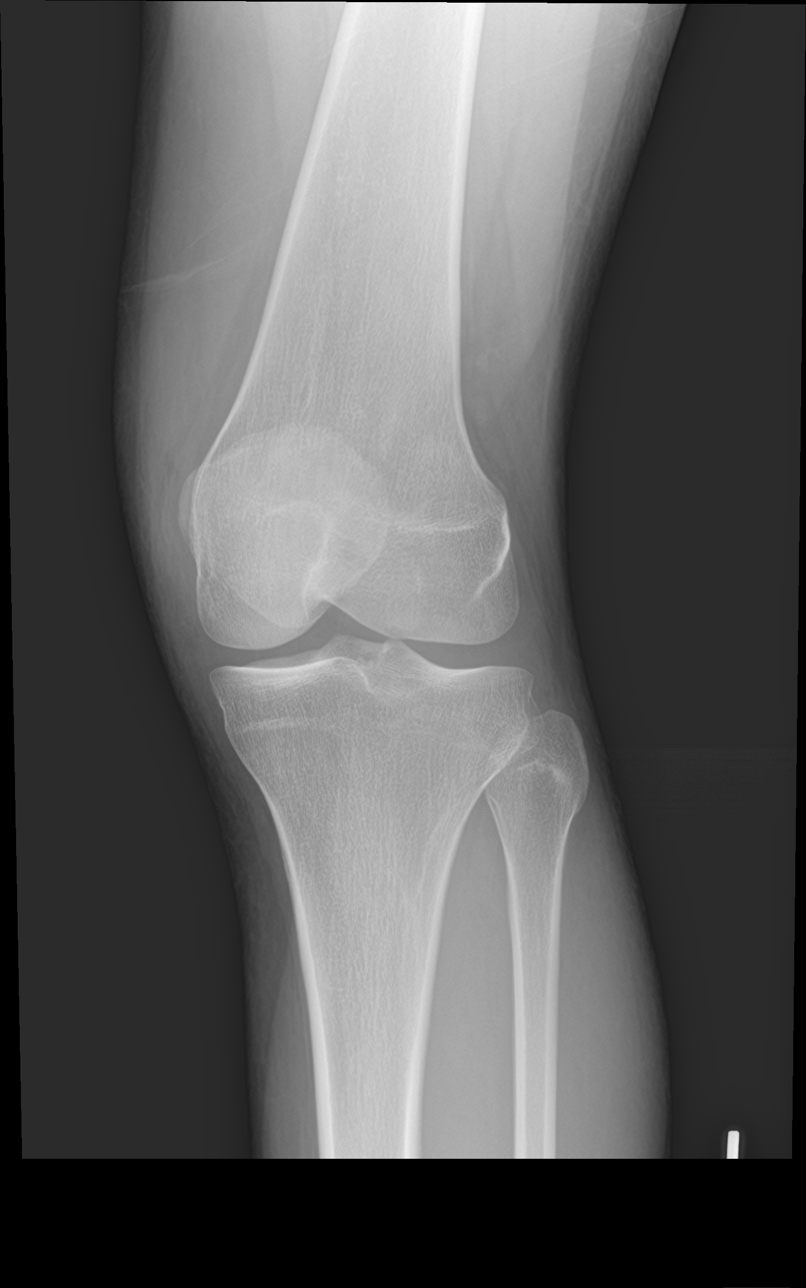

[knee obl (2 of 2)]
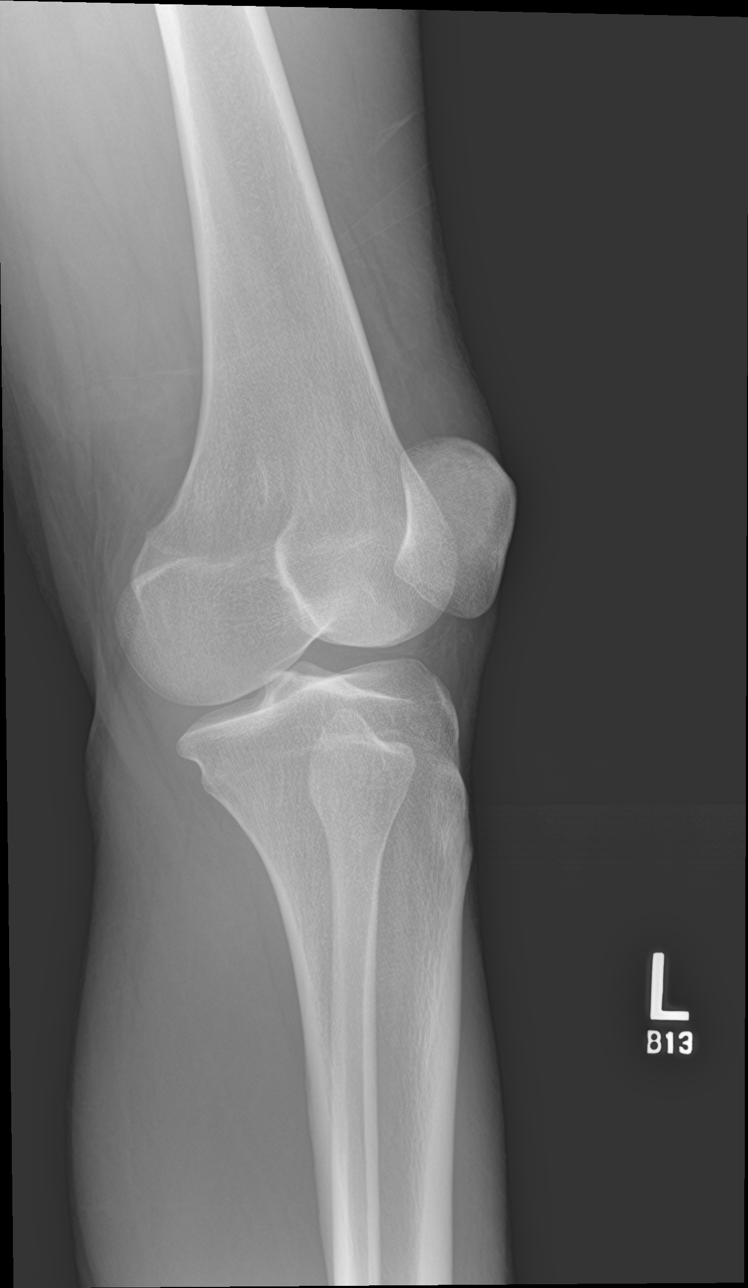

[knee lat]
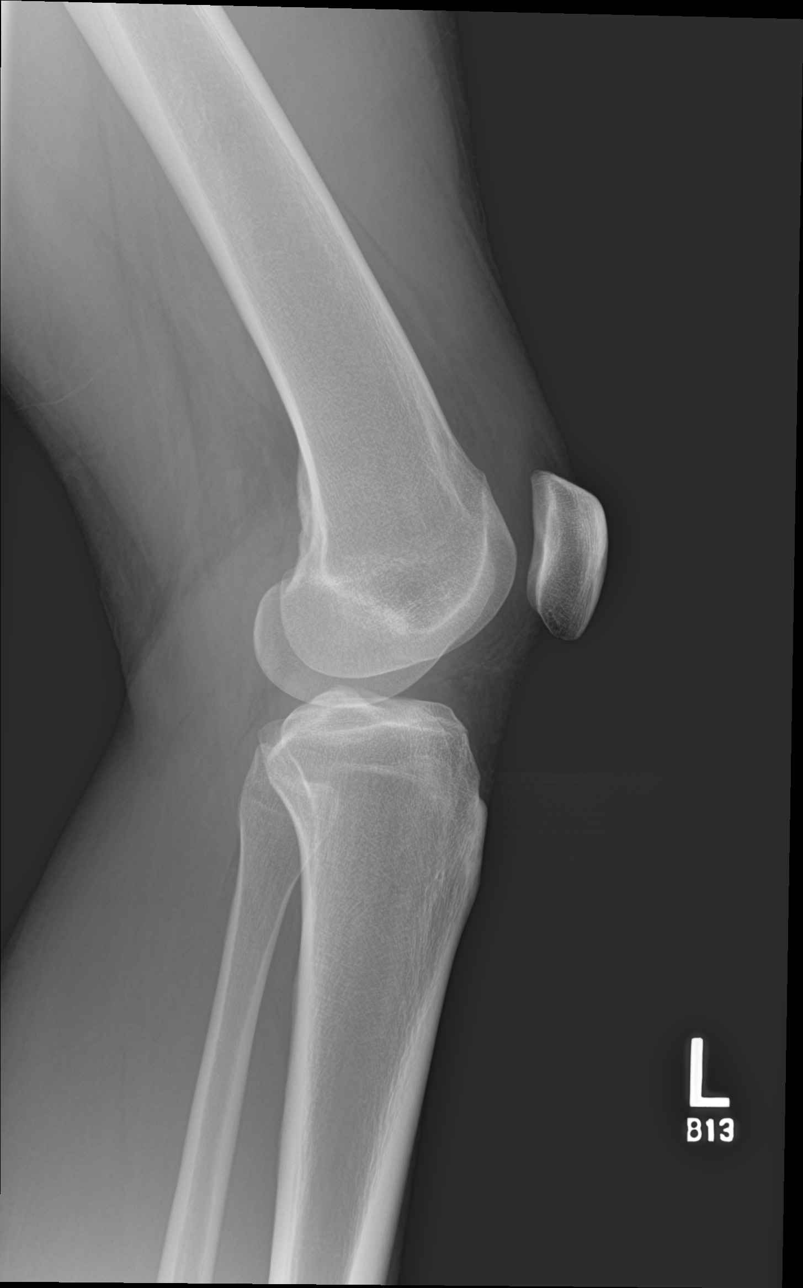

[4 of 4 positions shown; findings below may reference images not displayed]

FINDINGS: No acute fracture or malalignment. Small suprapatellar joint
effusion. Joint spaces are preserved. Bone mineralization is normal.
Soft tissues are unremarkable.
IMPRESSION: 1. Small suprapatellar joint effusion. No acute osseous abnormality.

## 2019-07-10 ENCOUNTER — Ambulatory Visit: Payer: Medicaid Other

## 2019-07-10 ENCOUNTER — Encounter: Payer: Self-pay | Admitting: Obstetrics and Gynecology

## 2019-08-18 ENCOUNTER — Emergency Department (HOSPITAL_COMMUNITY)
Admission: EM | Admit: 2019-08-18 | Discharge: 2019-08-18 | Disposition: A | Discharge status: Home / Self Care | Payer: Medicaid Other | Attending: Emergency Medicine | Admitting: Emergency Medicine

## 2019-08-18 ENCOUNTER — Emergency Department (HOSPITAL_COMMUNITY): Payer: Medicaid Other

## 2019-08-18 ENCOUNTER — Other Ambulatory Visit: Payer: Self-pay

## 2019-08-18 ENCOUNTER — Encounter (HOSPITAL_COMMUNITY): Payer: Self-pay | Admitting: Emergency Medicine

## 2019-08-18 DIAGNOSIS — O4691 Antepartum hemorrhage, unspecified, first trimester: Secondary | ICD-10-CM | POA: Diagnosis not present

## 2019-08-18 DIAGNOSIS — R102 Pelvic and perineal pain: Secondary | ICD-10-CM | POA: Insufficient documentation

## 2019-08-18 DIAGNOSIS — N939 Abnormal uterine and vaginal bleeding, unspecified: Secondary | ICD-10-CM | POA: Diagnosis present

## 2019-08-18 DIAGNOSIS — Z3A11 11 weeks gestation of pregnancy: Secondary | ICD-10-CM | POA: Insufficient documentation

## 2019-08-18 DIAGNOSIS — O418X10 Other specified disorders of amniotic fluid and membranes, first trimester, not applicable or unspecified: Secondary | ICD-10-CM

## 2019-08-18 LAB — COMPREHENSIVE METABOLIC PANEL WITH GFR
ALT: 23 U/L (ref 0–44)
AST: 26 U/L (ref 15–41)
Albumin: 4.3 g/dL (ref 3.5–5.0)
Alkaline Phosphatase: 39 U/L (ref 38–126)
Anion gap: 10 (ref 5–15)
BUN: 11 mg/dL (ref 6–20)
CO2: 26 mmol/L (ref 22–32)
Calcium: 9.7 mg/dL (ref 8.9–10.3)
Chloride: 102 mmol/L (ref 98–111)
Creatinine, Ser: 0.56 mg/dL (ref 0.44–1.00)
GFR calc Af Amer: >60 mL/min
GFR calc non Af Amer: >60 mL/min
Glucose, Bld: 101 mg/dL — ABNORMAL HIGH (ref 70–99)
Potassium: 3.2 mmol/L — ABNORMAL LOW (ref 3.5–5.1)
Sodium: 138 mmol/L (ref 135–145)
Total Bilirubin: 0.5 mg/dL (ref 0.3–1.2)
Total Protein: 8.3 g/dL — ABNORMAL HIGH (ref 6.5–8.1)

## 2019-08-18 LAB — URINALYSIS, ROUTINE W REFLEX MICROSCOPIC
Bilirubin Urine: NEGATIVE
Glucose, UA: NEGATIVE mg/dL
Hgb urine dipstick: NEGATIVE
Ketones, ur: 5 mg/dL — AB
Nitrite: NEGATIVE
Protein, ur: NEGATIVE mg/dL
Specific Gravity, Urine: 1.028 (ref 1.005–1.030)
pH: 5 (ref 5.0–8.0)

## 2019-08-18 LAB — CBC WITH DIFFERENTIAL/PLATELET
Abs Immature Granulocytes: 0.01 K/uL (ref 0.00–0.07)
Basophils Absolute: 0.1 K/uL (ref 0.0–0.1)
Basophils Relative: 1 %
Eosinophils Absolute: 0.1 K/uL (ref 0.0–0.5)
Eosinophils Relative: 2 %
HCT: 34.1 % — ABNORMAL LOW (ref 36.0–46.0)
Hemoglobin: 10.9 g/dL — ABNORMAL LOW (ref 12.0–15.0)
Immature Granulocytes: 0 %
Lymphocytes Relative: 30 %
Lymphs Abs: 1.9 K/uL (ref 0.7–4.0)
MCH: 28 pg (ref 26.0–34.0)
MCHC: 32 g/dL (ref 30.0–36.0)
MCV: 87.7 fL (ref 80.0–100.0)
Monocytes Absolute: 0.7 K/uL (ref 0.1–1.0)
Monocytes Relative: 10 %
Neutro Abs: 3.7 K/uL (ref 1.7–7.7)
Neutrophils Relative %: 57 %
Platelets: 241 K/uL (ref 150–400)
RBC: 3.89 MIL/uL (ref 3.87–5.11)
RDW: 13.7 % (ref 11.5–15.5)
WBC: 6.5 K/uL (ref 4.0–10.5)
nRBC: 0 % (ref 0.0–0.2)

## 2019-08-18 LAB — HCG, QUANTITATIVE, PREGNANCY: hCG, Beta Chain, Quant, S: 59369 m[IU]/mL — ABNORMAL HIGH (ref <5)

## 2019-08-18 LAB — WET PREP, GENITAL
Clue Cells Wet Prep HPF POC: NONE SEEN
Sperm: NONE SEEN
Trich, Wet Prep: NONE SEEN
Yeast Wet Prep HPF POC: NONE SEEN

## 2019-08-18 LAB — LIPASE, BLOOD: Lipase: 23 U/L (ref 11–51)

## 2019-08-18 MED ORDER — SODIUM CHLORIDE 0.9% FLUSH
3.0000 mL | Freq: Once | INTRAVENOUS | Status: AC
  Administered 2019-08-18: 03:00:00 3 mL via INTRAVENOUS

## 2019-08-18 MED ORDER — SODIUM CHLORIDE 0.9 % IV BOLUS
1000.0000 mL | Freq: Once | INTRAVENOUS | Status: AC
  Administered 2019-08-18: 04:00:00 1000 mL via INTRAVENOUS

## 2019-08-18 MED ORDER — POTASSIUM CHLORIDE CRYS ER 20 MEQ PO TBCR
40.0000 meq | EXTENDED_RELEASE_TABLET | Freq: Once | ORAL | Status: AC
  Administered 2019-08-18: 40 meq via ORAL
  Filled 2019-08-18: qty 2

## 2019-08-18 NOTE — ED Notes (Signed)
Petrucelli, PA at bedside to answer patients further questions.

## 2019-08-18 NOTE — Discharge Instructions (Addendum)
You were seen in the emergency department today for vaginal bleeding and pregnancy.  Your labs were overall reassuring.  Your potassium was a bit low, please follow attached diet guidelines.  Your ultrasound showed a subchorionic hemorrhage, please see attached information regarding subchorionic hematomas.  Please take Tylenol per over-the-counter dosing to help with discomfort.  Please follow-up with your OB/GYN within 3 days.  Return to the ER or the MAU (our women's health emergency department) for any new or worsening symptoms including but not limited to increased pain, increased bleeding, lightheadedness, dizziness, passing out, chest pain, trouble breathing, or any other concerns.

## 2019-08-18 NOTE — ED Notes (Signed)
Patient had further questions for provider. Pt inquiring about results of ultrasound that was completed. Provider made aware.

## 2019-08-18 NOTE — ED Provider Notes (Signed)
[facility]-EMERGENCY DEPT Provider Note   CSN: [id number] Arrival date & time: 08/18/19  0151     History Chief Complaint  Patient presents with  . Vaginal Bleeding    Erica Mccarthy is a 24 y.o. female with a hx of anxiety & depression who is G2P1A1 with prior ectopic pregnancy currently [redacted] weeks pregnant who presents to the ED with complaints of vaginal bleeding that began 40 minutes PTA. Patient reports bright red blood on the toilet paper with wiping with some pelvic cramping. No alleviating/aggravating factors. She became concerned and came to the ED. She states that she had some brown to red vaginal discharge yesterday- seen in an alternative ED and had pelvic exam, found to have BV- started on PO flagyl. Denies fever, chills, N/V, lightheadedness, dizziness, syncope, or dysuria.   HPI     Past Medical History:  Diagnosis Date  . Anxiety   . Depression    not on meds, doing ok, has good support    Patient Active Problem List   Diagnosis Date Noted  . Pelvic pain 07/23/2017  . History of depression 03/25/2016    Past Surgical History:  Procedure Laterality Date  . DIAGNOSTIC LAPAROSCOPY WITH REMOVAL OF ECTOPIC PREGNANCY N/A 03/23/2017   Procedure: DIAGNOSTIC LAPAROSCOPY WITH REMOVAL OF ECTOPIC PREGNANCY AND OMENTAL TISSUE, RIGHT SALPINGECTOMY;  Surgeon: Tereso Newcomer, MD;  Location: [facility];  Service: Gynecology;  Laterality: N/A;  . NO PAST SURGERIES       OB History    Gravida  2   Para      Term      Preterm      AB      Living        SAB      TAB      Ectopic      Multiple      Live Births              Family History  Problem Relation Age of Onset  . Diabetes Paternal Grandfather   . Hypertension Paternal Grandfather   . Diabetes Father   . Aneurysm Paternal Grandmother     Social History   Tobacco Use  . Smoking status: Never Smoker  . Smokeless tobacco: Never Used  Substance Use Topics  . Alcohol use:  Yes    Comment: occasionally, none since +PREG  . Drug use: No    Home Medications Prior to Admission medications   Medication Sig Start Date End Date Taking? Authorizing Provider  meloxicam (MOBIC) 7.5 MG tablet Take 1 tablet (7.5 mg total) by mouth daily. 08/30/17   Cathie Hoops, Amy V, PA-C    Allergies    Latex and Other  Review of Systems   Review of Systems  Constitutional: Negative for chills and fever.  Respiratory: Negative for shortness of breath.   Cardiovascular: Negative for chest pain.  Gastrointestinal: Negative for blood in stool, constipation, diarrhea, nausea and vomiting.  Genitourinary: Positive for pelvic pain, vaginal bleeding and vaginal discharge (yesterday). Negative for dysuria.  Neurological: Negative for dizziness, syncope and light-headedness.  All other systems reviewed and are negative.   Physical Exam Updated Vital Signs BP 116/76 (BP Location: Left Arm)   Pulse 75   Temp 98.3 F (36.8 C) (Oral)   Resp 16   Ht 5\' 4"  (1.626 m)   SpO2 100%   BMI 24.20 kg/m   Physical Exam Vitals and nursing note reviewed. Exam conducted with a chaperone present.  Constitutional:  General: She is not in acute distress.    Appearance: She is well-developed. She is not toxic-appearing.  HENT:     Head: Normocephalic and atraumatic.  Eyes:     General:        Right eye: No discharge.        Left eye: No discharge.     Conjunctiva/sclera: Conjunctivae normal.  Cardiovascular:     Rate and Rhythm: Normal rate and regular rhythm.  Pulmonary:     Effort: Pulmonary effort is normal. No respiratory distress.     Breath sounds: Normal breath sounds. No wheezing, rhonchi or rales.  Abdominal:     General: There is no distension.     Palpations: Abdomen is soft.     Tenderness: There is abdominal tenderness in the suprapubic area. There is no right CVA tenderness, left CVA tenderness, guarding or rebound.  Genitourinary:    Labia:        Right: No lesion.         Left: No lesion.      Cervix: No cervical motion tenderness.     Adnexa:        Right: No tenderness or fullness.         Left: No tenderness or fullness.       Comments: Maroon colored vaginal discharge present. No active bleeding noted. Cervical os is closed.  Musculoskeletal:     Cervical back: Neck supple.  Skin:    General: Skin is warm and dry.     Findings: No rash.  Neurological:     Mental Status: She is alert.     Comments: Clear speech.   Psychiatric:        Behavior: Behavior normal.     ED Results / Procedures / Treatments   Labs (all labs ordered are listed, but only abnormal results are displayed) Labs Reviewed  WET PREP, GENITAL - Abnormal; Notable for the following components:      Result Value   WBC, Wet Prep HPF POC FEW (*)    All other components within normal limits  HCG, QUANTITATIVE, PREGNANCY - Abnormal; Notable for the following components:   hCG, Beta Chain, Quant, S 59,369 (*)    All other components within normal limits  COMPREHENSIVE METABOLIC PANEL - Abnormal; Notable for the following components:   Potassium 3.2 (*)    Glucose, Bld 101 (*)    Total Protein 8.3 (*)    All other components within normal limits  URINALYSIS, ROUTINE W REFLEX MICROSCOPIC - Abnormal; Notable for the following components:   Ketones, ur 5 (*)    Leukocytes,Ua TRACE (*)    Bacteria, UA RARE (*)    All other components within normal limits  CBC WITH DIFFERENTIAL/PLATELET - Abnormal; Notable for the following components:   Hemoglobin 10.9 (*)    HCT 34.1 (*)    All other components within normal limits  URINE CULTURE  LIPASE, BLOOD  GC/CHLAMYDIA PROBE AMP ([place]) NOT AT [facility]    EKG None  Radiology US OB Comp Less 14 Wks  Result Date: 08/18/2019 CLINICAL DATA:  Vaginal bleeding EXAM: OBSTETRIC <14 WK ULTRASOUND TECHNIQUE: Transabdominal ultrasound was performed for evaluation of the gestation as well as the maternal uterus and adnexal regions.  COMPARISON:  None. FINDINGS: Intrauterine gestational sac: Single Yolk sac:  Visualized. Embryo:  Visualized. Cardiac Activity: Visualized. Heart Rate: 160 bpm CRL: 4.48 cm 11 w 2 d  [place] EDC: 03/06/2020 Subchorionic hemorrhage: There is a moderate volume of subchorionic hemorrhage. Maternal uterus/adnexae: The ovaries are unremarkable. There is no significant free fluid. IMPRESSION: 1. Single live IUP at 11 weeks and 2 days as detailed above. 2. Moderate volume subchorionic hemorrhage. Electronically Signed   By: Katherine Mantle M.D.   On: 08/18/2019 03:14    Procedures Procedures (including critical care time)  Medications Ordered in ED Medications  sodium chloride flush (NS) 0.9 % injection 3 mL (has no administration in time range)    ED Course  I have reviewed the triage vital signs and the nursing notes.  Pertinent labs & imaging results that were available during my care of the patient were reviewed by me and considered in my medical decision making (see chart for details).  MDM Rules/Calculators/A&P                      Patient currently about [redacted] weeks pregnant who presents to the ED with complaints of vaginal bleeding & pelvic cramping tonight. Patient nontoxic appearing, no apparent distress, vitals without significant abnormality. Abdominal exam mild suprapubic tenderness, no peritoneal signs . Pelvic exam with maroon vaginal discharge, no significant active bleeding, cervical os is closed, no adnexal/cervical motion tenderness. Plan for labs & [place].   CBC: Anemia similar to prior.  No leukocytosis. CMP: Mild hypokalemia- oral replacement. LFTs & renal function WNL Lipase: WNL UA: rare bacteria, trace leuks- will not tx and send for culture per discussion w/ Dr. Marzella Schlein prep: No BV/yeast/trich GC/Chlamydia: Pending, patient without concern for STDs, doubt PID.  ABO/Rh: known O positive.  [place]: 1. Single live IUP at 11 weeks and 2 days as detailed above. 2.  Moderate volume subchorionic hemorrhage.   I discussed results, treatment plan, need for follow-up with obgyn, and return precautions with the patient. Additionally provided MAU information. Provided opportunity for questions, patient confirmed understanding and is in agreement with plan.   Findings and plan of care discussed with supervising physician Dr. Read Drivers who is in agreement.   Final Clinical Impression(s) / ED Diagnoses Final diagnoses:  Subchorionic hemorrhage of placenta in first trimester, single or unspecified fetus    Rx / DC Orders ED Discharge Orders    None       Cherly Anderson, PA-C 08/18/19 0541    Paula Libra, MD 08/18/19 [phone]

## 2019-08-18 NOTE — ED Triage Notes (Signed)
Patient is [redacted] weeks pregnant. Patient is complaining of bleeding. Asked patient how much she was bleeding she could not tell me. She states she wiped and it was blood on the tissue. Patient is having lower abdominal pain.

## 2019-08-19 LAB — URINE CULTURE: Culture: NO GROWTH

## 2019-08-21 LAB — GC/CHLAMYDIA PROBE AMP (~~LOC~~) NOT AT ARMC
Chlamydia: NEGATIVE
Neisseria Gonorrhea: NEGATIVE

## 2020-04-10 IMAGING — US US OB COMP LESS 14 WK
1 series · 14 of 28 positions shown · non-contrast
Comparison: None.

CLINICAL DATA: Vaginal bleeding

EXAM:
OBSTETRIC <14 WK ULTRASOUND
TECHNIQUE: Transabdominal ultrasound was performed for evaluation of the
gestation as well as the maternal uterus and adnexal regions.

[Series 1: us ob comp less 14 wk · 14 of 195 slices shown]
[im 8/195]
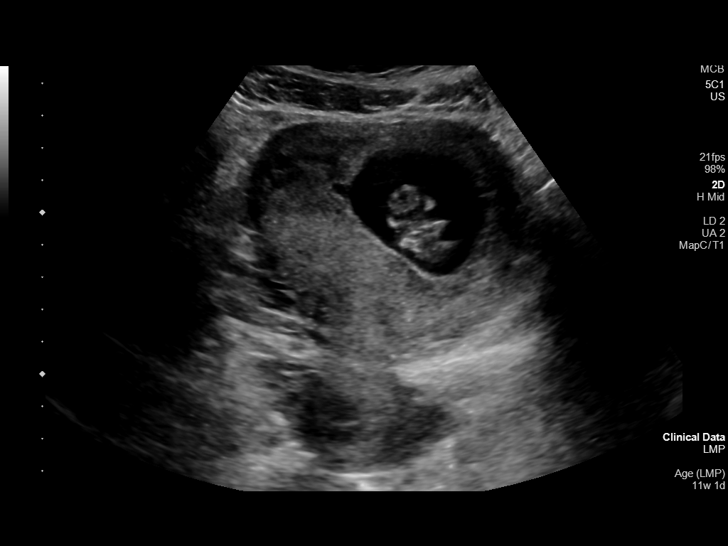
[im 22/195]
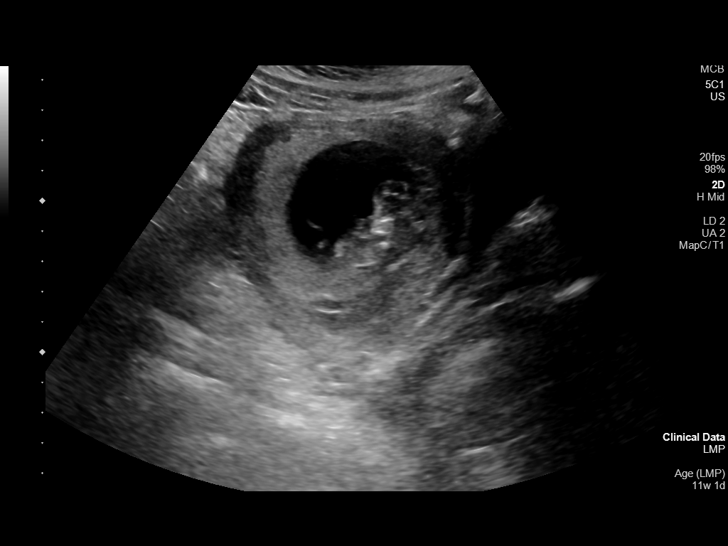
[im 36/195]
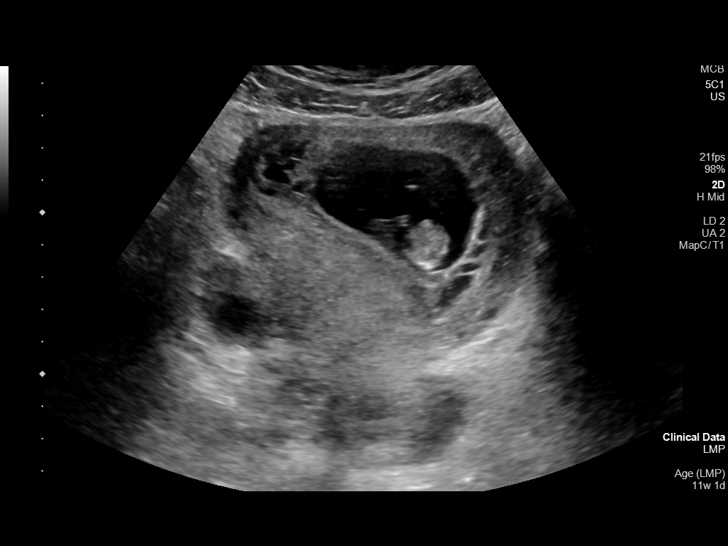
[im 51/195]
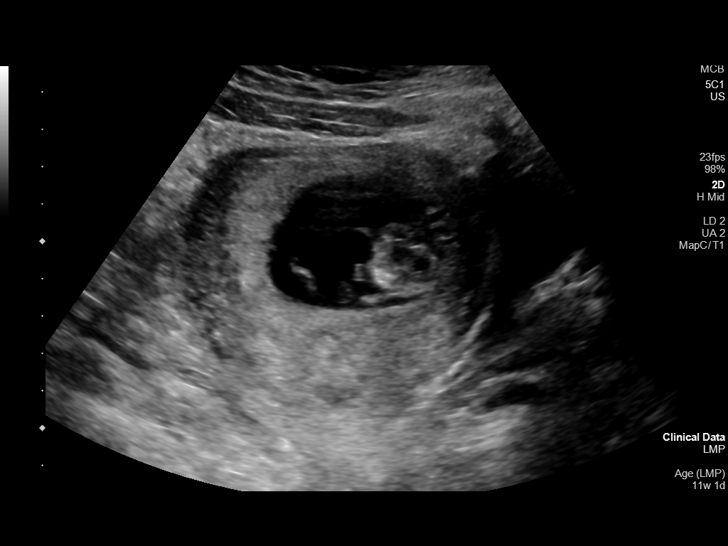
[im 65/195]
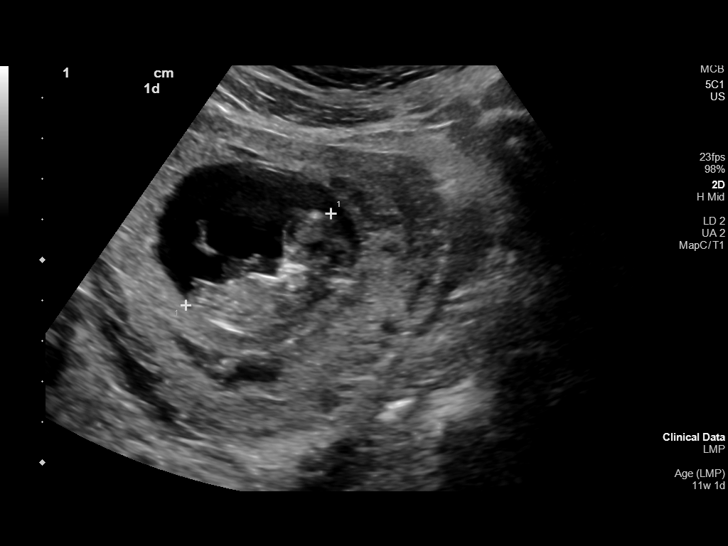
[im 80/195]
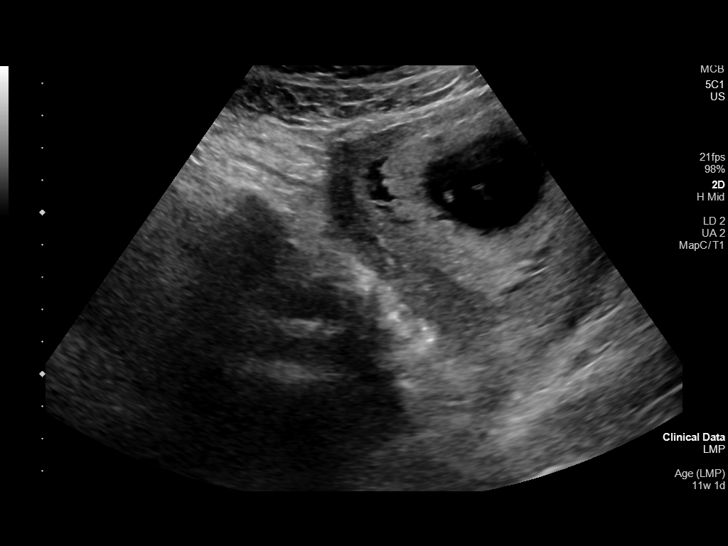
[im 94/195]
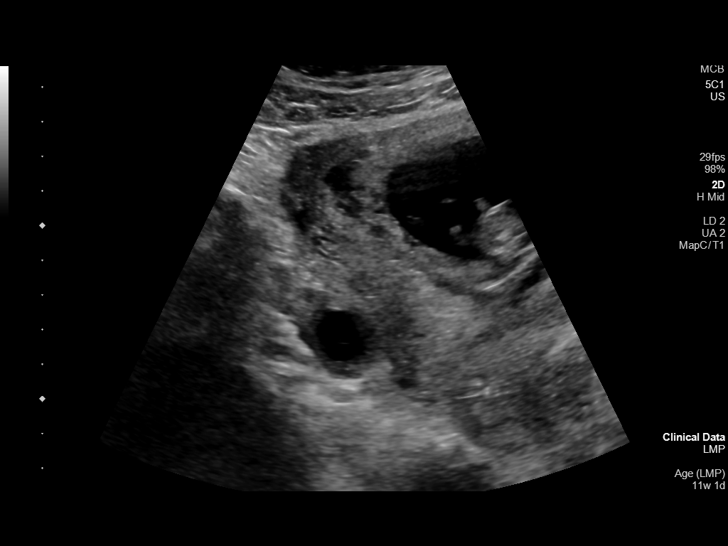
[im 108/195]
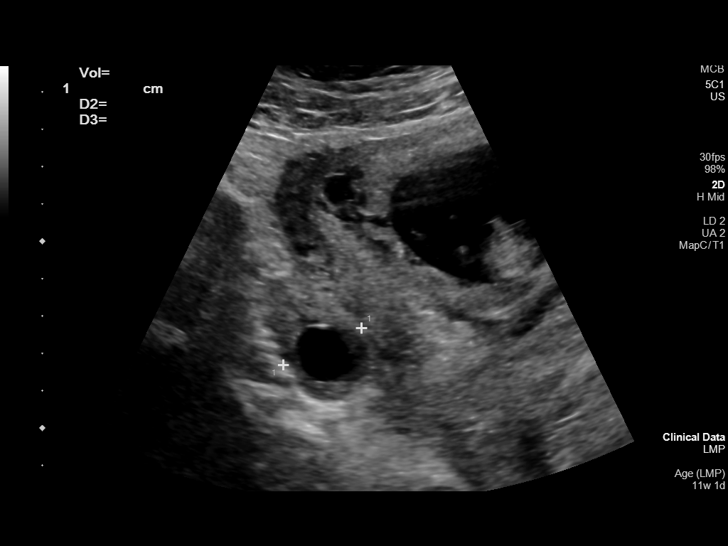
[im 123/195]
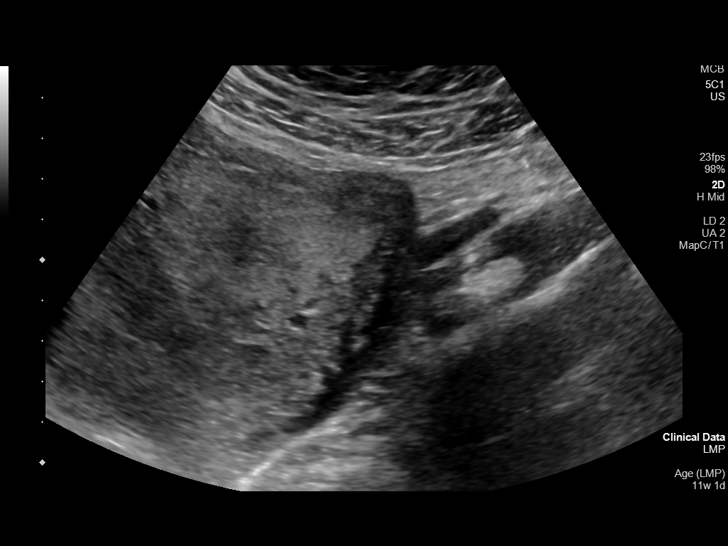
[im 137/195]
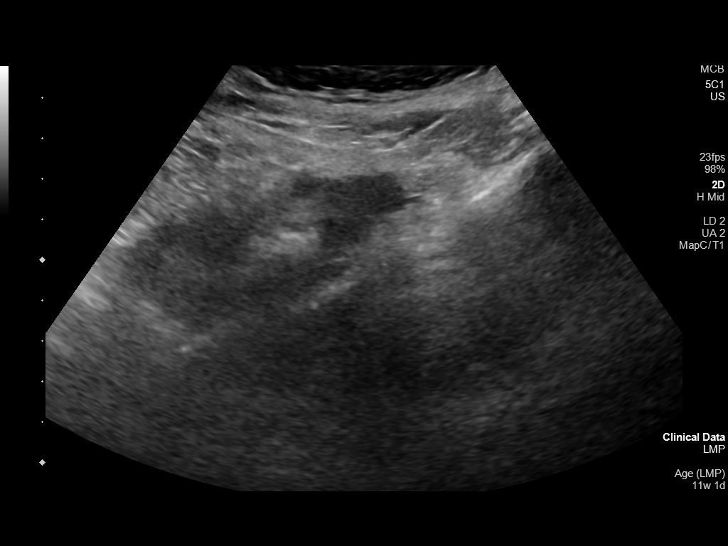
[im 151/195]
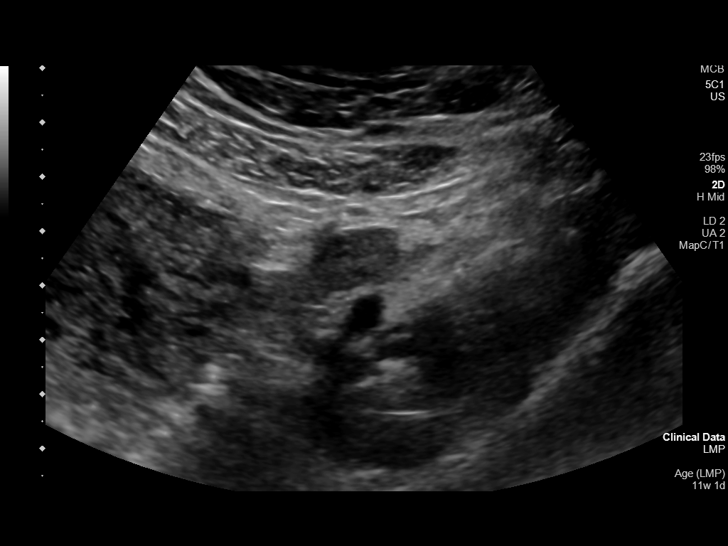
[im 166/195]
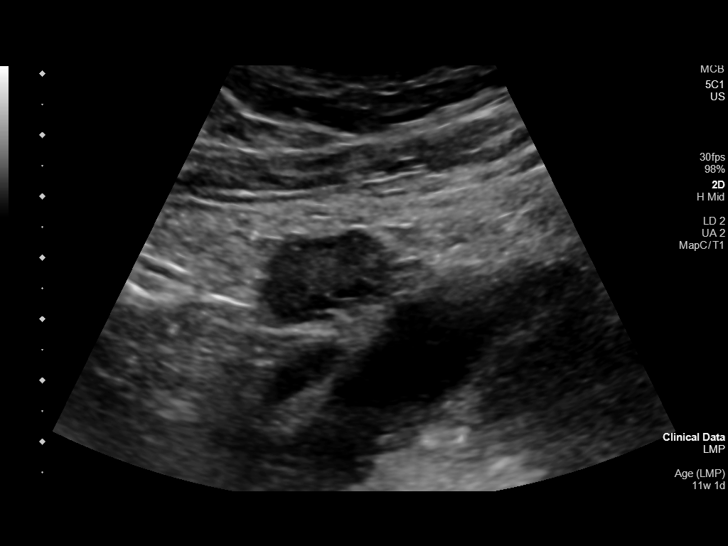
[im 180/195]
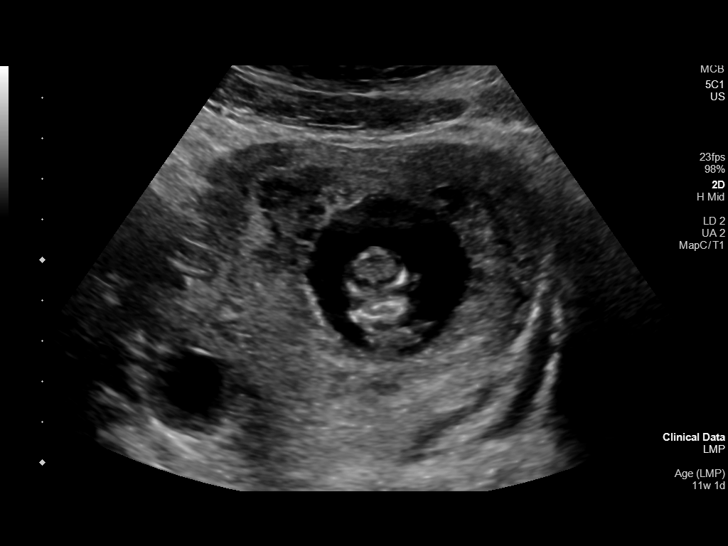
[im 195/195]
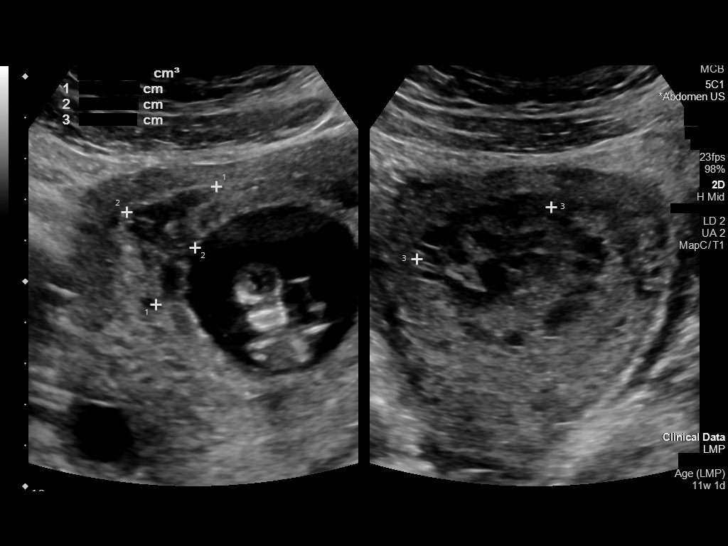

[14 of 28 positions shown; findings below may reference images not displayed]

FINDINGS: Intrauterine gestational sac: Single

Yolk sac:  Visualized.

Embryo:  Visualized.

Cardiac Activity: Visualized.

Heart Rate: 160 bpm

CRL: 4.48 cm 11 w 2 d                  US EDC: 03/06/2020

Subchorionic hemorrhage: There is a moderate volume of subchorionic
hemorrhage.

Maternal uterus/adnexae: The ovaries are unremarkable. There is no
significant free fluid.
IMPRESSION: 1. Single live IUP at 11 weeks and 2 days as detailed above.
2. Moderate volume subchorionic hemorrhage.
